# Patient Record
Sex: Male | Born: 1965 | Race: White | Hispanic: No | Marital: Single | Smoking: Never smoker
Health system: Southern US, Community
[De-identification: ages and names within clinical notes are randomized; demographics above are authoritative.]

## PROBLEM LIST (undated history)

## (undated) DIAGNOSIS — J45909 Unspecified asthma, uncomplicated: Secondary | ICD-10-CM

## (undated) DIAGNOSIS — F952 Tourette's disorder: Secondary | ICD-10-CM

## (undated) DIAGNOSIS — I10 Essential (primary) hypertension: Secondary | ICD-10-CM

## (undated) DIAGNOSIS — R569 Unspecified convulsions: Secondary | ICD-10-CM

## (undated) HISTORY — PX: TYMPANOSTOMY TUBE PLACEMENT: SHX32

---

## 2017-03-30 ENCOUNTER — Emergency Department (HOSPITAL_COMMUNITY): Payer: Non-veteran care

## 2017-03-30 ENCOUNTER — Inpatient Hospital Stay (HOSPITAL_COMMUNITY)
Admission: EM | Admit: 2017-03-30 | Discharge: 2017-04-03 | DRG: 637 | Disposition: A | Discharge status: Home / Self Care | Payer: Non-veteran care | Attending: Internal Medicine | Admitting: Internal Medicine

## 2017-03-30 ENCOUNTER — Encounter (HOSPITAL_COMMUNITY): Payer: Self-pay | Admitting: Nurse Practitioner

## 2017-03-30 DIAGNOSIS — G40909 Epilepsy, unspecified, not intractable, without status epilepticus: Secondary | ICD-10-CM | POA: Diagnosis present

## 2017-03-30 DIAGNOSIS — K859 Acute pancreatitis without necrosis or infection, unspecified: Secondary | ICD-10-CM

## 2017-03-30 DIAGNOSIS — Z79899 Other long term (current) drug therapy: Secondary | ICD-10-CM | POA: Diagnosis not present

## 2017-03-30 DIAGNOSIS — J45909 Unspecified asthma, uncomplicated: Secondary | ICD-10-CM | POA: Diagnosis present

## 2017-03-30 DIAGNOSIS — R1084 Generalized abdominal pain: Secondary | ICD-10-CM

## 2017-03-30 DIAGNOSIS — K219 Gastro-esophageal reflux disease without esophagitis: Secondary | ICD-10-CM | POA: Diagnosis present

## 2017-03-30 DIAGNOSIS — I10 Essential (primary) hypertension: Secondary | ICD-10-CM | POA: Diagnosis present

## 2017-03-30 DIAGNOSIS — R112 Nausea with vomiting, unspecified: Secondary | ICD-10-CM

## 2017-03-30 DIAGNOSIS — Z6841 Body Mass Index (BMI) 40.0 and over, adult: Secondary | ICD-10-CM

## 2017-03-30 DIAGNOSIS — Z888 Allergy status to other drugs, medicaments and biological substances status: Secondary | ICD-10-CM | POA: Diagnosis not present

## 2017-03-30 DIAGNOSIS — Z880 Allergy status to penicillin: Secondary | ICD-10-CM

## 2017-03-30 DIAGNOSIS — D649 Anemia, unspecified: Secondary | ICD-10-CM | POA: Diagnosis present

## 2017-03-30 DIAGNOSIS — F952 Tourette's disorder: Secondary | ICD-10-CM | POA: Diagnosis present

## 2017-03-30 DIAGNOSIS — K760 Fatty (change of) liver, not elsewhere classified: Secondary | ICD-10-CM | POA: Diagnosis present

## 2017-03-30 DIAGNOSIS — G4733 Obstructive sleep apnea (adult) (pediatric): Secondary | ICD-10-CM | POA: Diagnosis present

## 2017-03-30 DIAGNOSIS — Z7982 Long term (current) use of aspirin: Secondary | ICD-10-CM | POA: Diagnosis not present

## 2017-03-30 DIAGNOSIS — N179 Acute kidney failure, unspecified: Secondary | ICD-10-CM | POA: Diagnosis present

## 2017-03-30 DIAGNOSIS — R109 Unspecified abdominal pain: Secondary | ICD-10-CM | POA: Diagnosis present

## 2017-03-30 DIAGNOSIS — Z9114 Patient's other noncompliance with medication regimen: Secondary | ICD-10-CM | POA: Diagnosis not present

## 2017-03-30 DIAGNOSIS — R630 Anorexia: Secondary | ICD-10-CM | POA: Diagnosis present

## 2017-03-30 DIAGNOSIS — E871 Hypo-osmolality and hyponatremia: Secondary | ICD-10-CM | POA: Diagnosis present

## 2017-03-30 DIAGNOSIS — E1110 Type 2 diabetes mellitus with ketoacidosis without coma: Secondary | ICD-10-CM | POA: Diagnosis present

## 2017-03-30 DIAGNOSIS — R5383 Other fatigue: Secondary | ICD-10-CM

## 2017-03-30 DIAGNOSIS — R569 Unspecified convulsions: Secondary | ICD-10-CM

## 2017-03-30 HISTORY — DX: Essential (primary) hypertension: I10

## 2017-03-30 HISTORY — DX: Unspecified convulsions: R56.9

## 2017-03-30 HISTORY — DX: Tourette's disorder: F95.2

## 2017-03-30 HISTORY — DX: Unspecified asthma, uncomplicated: J45.909

## 2017-03-30 LAB — BASIC METABOLIC PANEL
ANION GAP: 13 (ref 5–15)
BUN: 54 mg/dL — ABNORMAL HIGH (ref 6–20)
CALCIUM: 9.2 mg/dL (ref 8.9–10.3)
CO2: 14 mmol/L — ABNORMAL LOW (ref 22–32)
Chloride: 107 mmol/L (ref 101–111)
Creatinine, Ser: 1.85 mg/dL — ABNORMAL HIGH (ref 0.61–1.24)
GFR calc Af Amer: 47 mL/min — ABNORMAL LOW (ref >60)
GFR, EST NON AFRICAN AMERICAN: 40 mL/min — AB (ref >60)
Glucose, Bld: 214 mg/dL — ABNORMAL HIGH (ref 65–99)
POTASSIUM: 4.5 mmol/L (ref 3.5–5.1)
Sodium: 134 mmol/L — ABNORMAL LOW (ref 135–145)

## 2017-03-30 LAB — CBC WITH DIFFERENTIAL/PLATELET
Basophils Absolute: 0 10*3/uL (ref 0.0–0.1)
Basophils Relative: 0 %
Eosinophils Absolute: 0 10*3/uL (ref 0.0–0.7)
Eosinophils Relative: 0 %
HEMATOCRIT: 45.7 % (ref 39.0–52.0)
HEMOGLOBIN: 15.8 g/dL (ref 13.0–17.0)
LYMPHS ABS: 1.2 10*3/uL (ref 0.7–4.0)
LYMPHS PCT: 13 %
MCH: 29.8 pg (ref 26.0–34.0)
MCHC: 34.6 g/dL (ref 30.0–36.0)
MCV: 86.2 fL (ref 78.0–100.0)
MONOS PCT: 9 %
Monocytes Absolute: 0.9 10*3/uL (ref 0.1–1.0)
NEUTROS PCT: 78 %
Neutro Abs: 7.5 10*3/uL (ref 1.7–7.7)
Platelets: 219 10*3/uL (ref 150–400)
RBC: 5.3 MIL/uL (ref 4.22–5.81)
RDW: 14.4 % (ref 11.5–15.5)
WBC: 9.6 10*3/uL (ref 4.0–10.5)

## 2017-03-30 LAB — CBG MONITORING, ED
GLUCOSE-CAPILLARY: 564 mg/dL — AB (ref 65–99)
Glucose-Capillary: 481 mg/dL — ABNORMAL HIGH (ref 65–99)
Glucose-Capillary: 352 mg/dL — ABNORMAL HIGH (ref 65–99)

## 2017-03-30 LAB — COMPREHENSIVE METABOLIC PANEL
ALK PHOS: 84 U/L (ref 38–126)
ALT: 62 U/L (ref 17–63)
ANION GAP: 20 — AB (ref 5–15)
AST: 34 U/L (ref 15–41)
Albumin: 4 g/dL (ref 3.5–5.0)
BILIRUBIN TOTAL: 1.4 mg/dL — AB (ref 0.3–1.2)
BUN: 57 mg/dL — ABNORMAL HIGH (ref 6–20)
CO2: 9 mmol/L — ABNORMAL LOW (ref 22–32)
CREATININE: 2.16 mg/dL — AB (ref 0.61–1.24)
Calcium: 9.7 mg/dL (ref 8.9–10.3)
Chloride: 94 mmol/L — ABNORMAL LOW (ref 101–111)
GFR calc non Af Amer: 33 mL/min — ABNORMAL LOW (ref >60)
GFR, EST AFRICAN AMERICAN: 39 mL/min — AB (ref >60)
Glucose, Bld: 768 mg/dL (ref 65–99)
Potassium: 5.9 mmol/L — ABNORMAL HIGH (ref 3.5–5.1)
Sodium: 123 mmol/L — ABNORMAL LOW (ref 135–145)
Total Protein: 8.5 g/dL — ABNORMAL HIGH (ref 6.5–8.1)

## 2017-03-30 LAB — URINALYSIS, ROUTINE W REFLEX MICROSCOPIC
BACTERIA UA: NONE SEEN
BILIRUBIN URINE: NEGATIVE
KETONES UR: 20 mg/dL — AB
Leukocytes, UA: NEGATIVE
NITRITE: NEGATIVE
PH: 5 (ref 5.0–8.0)
Protein, ur: 30 mg/dL — AB
Specific Gravity, Urine: 1.02 (ref 1.005–1.030)

## 2017-03-30 LAB — BLOOD GAS, ARTERIAL
Acid-base deficit: 18.9 mmol/L — ABNORMAL HIGH (ref 0.0–2.0)
Bicarbonate: 7.9 mmol/L — ABNORMAL LOW (ref 20.0–28.0)
Drawn by: 103701
FIO2: 21
O2 Saturation: 97.5 %
PCO2 ART: 21.2 mmHg — AB (ref 32.0–48.0)
PH ART: 7.199 — AB (ref 7.350–7.450)
Patient temperature: 98.6
pO2, Arterial: 118 mmHg — ABNORMAL HIGH (ref 83.0–108.0)

## 2017-03-30 LAB — I-STAT TROPONIN, ED: Troponin i, poc: 0 ng/mL (ref 0.00–0.08)

## 2017-03-30 LAB — BLOOD GAS, VENOUS
ACID-BASE DEFICIT: 18.2 mmol/L — AB (ref 0.0–2.0)
Bicarbonate: 9.9 mmol/L — ABNORMAL LOW (ref 20.0–28.0)
O2 SAT: 60.2 %
PCO2 VEN: 29.5 mmHg — AB (ref 44.0–60.0)
PO2 VEN: 36.1 mmHg (ref 32.0–45.0)
Patient temperature: 98.6
pH, Ven: 7.151 — CL (ref 7.250–7.430)

## 2017-03-30 LAB — MAGNESIUM: Magnesium: 2.9 mg/dL — ABNORMAL HIGH (ref 1.7–2.4)

## 2017-03-30 LAB — MRSA PCR SCREENING: MRSA BY PCR: NEGATIVE

## 2017-03-30 LAB — LIPASE, BLOOD: Lipase: 242 U/L — ABNORMAL HIGH (ref 11–51)

## 2017-03-30 LAB — TROPONIN I

## 2017-03-30 LAB — GLUCOSE, CAPILLARY
GLUCOSE-CAPILLARY: 197 mg/dL — AB (ref 65–99)
Glucose-Capillary: 224 mg/dL — ABNORMAL HIGH (ref 65–99)

## 2017-03-30 MED ORDER — FERROUS SULFATE 325 (65 FE) MG PO TABS
325.0000 mg | ORAL_TABLET | Freq: Three times a day (TID) | ORAL | Status: DC
  Administered 2017-03-30 – 2017-04-03 (×11): 325 mg via ORAL
  Filled 2017-03-30 (×11): qty 1

## 2017-03-30 MED ORDER — VITAMIN D3 25 MCG (1000 UNIT) PO TABS
2000.0000 [IU] | ORAL_TABLET | Freq: Every day | ORAL | Status: DC
  Administered 2017-03-31 – 2017-04-03 (×5): 2000 [IU] via ORAL
  Filled 2017-03-30 (×6): qty 2

## 2017-03-30 MED ORDER — CARBAMAZEPINE ER 200 MG PO TB12
600.0000 mg | ORAL_TABLET | Freq: Two times a day (BID) | ORAL | Status: DC
  Administered 2017-03-30 – 2017-04-03 (×8): 600 mg via ORAL
  Filled 2017-03-30 (×8): qty 3

## 2017-03-30 MED ORDER — ENOXAPARIN SODIUM 40 MG/0.4ML ~~LOC~~ SOLN
40.0000 mg | SUBCUTANEOUS | Status: DC
  Administered 2017-03-30 – 2017-04-02 (×4): 40 mg via SUBCUTANEOUS
  Filled 2017-03-30 (×4): qty 0.4

## 2017-03-30 MED ORDER — CLONIDINE HCL 0.1 MG PO TABS
0.1000 mg | ORAL_TABLET | Freq: Three times a day (TID) | ORAL | Status: DC
  Administered 2017-03-30 – 2017-04-03 (×11): 0.1 mg via ORAL
  Filled 2017-03-30 (×11): qty 1

## 2017-03-30 MED ORDER — MORPHINE SULFATE (PF) 4 MG/ML IV SOLN
4.0000 mg | Freq: Once | INTRAVENOUS | Status: AC
  Administered 2017-03-30: 4 mg via INTRAVENOUS
  Filled 2017-03-30: qty 1

## 2017-03-30 MED ORDER — VITAMIN B-12 1000 MCG PO TABS
5000.0000 ug | ORAL_TABLET | Freq: Every day | ORAL | Status: DC
  Administered 2017-03-31 – 2017-04-03 (×5): 5000 ug via ORAL
  Filled 2017-03-30 (×6): qty 5

## 2017-03-30 MED ORDER — SODIUM CHLORIDE 0.9 % IV BOLUS (SEPSIS)
1000.0000 mL | Freq: Once | INTRAVENOUS | Status: AC
  Administered 2017-03-30: 1000 mL via INTRAVENOUS

## 2017-03-30 MED ORDER — ASPIRIN EC 81 MG PO TBEC
81.0000 mg | DELAYED_RELEASE_TABLET | Freq: Every day | ORAL | Status: DC
  Administered 2017-03-30 – 2017-04-03 (×5): 81 mg via ORAL
  Filled 2017-03-30 (×5): qty 1

## 2017-03-30 MED ORDER — DEXTROSE-NACL 5-0.45 % IV SOLN: INTRAVENOUS | Status: DC

## 2017-03-30 MED ORDER — FAMOTIDINE IN NACL 20-0.9 MG/50ML-% IV SOLN
20.0000 mg | Freq: Once | INTRAVENOUS | Status: AC
  Administered 2017-03-30: 20 mg via INTRAVENOUS
  Filled 2017-03-30: qty 50

## 2017-03-30 MED ORDER — ADULT MULTIVITAMIN W/MINERALS CH
1.0000 | ORAL_TABLET | Freq: Every day | ORAL | Status: DC
  Administered 2017-03-30 – 2017-04-03 (×5): 1 via ORAL
  Filled 2017-03-30 (×5): qty 1

## 2017-03-30 MED ORDER — ALBUTEROL SULFATE (2.5 MG/3ML) 0.083% IN NEBU
3.0000 mL | INHALATION_SOLUTION | Freq: Four times a day (QID) | RESPIRATORY_TRACT | Status: DC | PRN
Start: 2017-03-30 — End: 2017-04-03
  Administered 2017-03-31: 3 mL via RESPIRATORY_TRACT

## 2017-03-30 MED ORDER — DEXTROSE-NACL 5-0.45 % IV SOLN
INTRAVENOUS | Status: DC
  Administered 2017-03-31 (×2): via INTRAVENOUS

## 2017-03-30 MED ORDER — ONDANSETRON HCL 4 MG/2ML IJ SOLN
4.0000 mg | Freq: Once | INTRAMUSCULAR | Status: AC
  Administered 2017-03-30: 4 mg via INTRAVENOUS
  Filled 2017-03-30: qty 2

## 2017-03-30 MED ORDER — COQ10 200 MG PO CAPS: 200.0000 mg | ORAL_CAPSULE | Freq: Every day | ORAL | Status: DC

## 2017-03-30 MED ORDER — SODIUM CHLORIDE 0.9 % IV SOLN
INTRAVENOUS | Status: DC
  Administered 2017-03-30: 5.4 [IU]/h via INTRAVENOUS
  Filled 2017-03-30: qty 1

## 2017-03-30 MED ORDER — SODIUM CHLORIDE 0.9 % IV SOLN
INTRAVENOUS | Status: DC
  Filled 2017-03-30 (×2): qty 1

## 2017-03-30 MED ORDER — MONTELUKAST SODIUM 10 MG PO TABS
10.0000 mg | ORAL_TABLET | Freq: Every day | ORAL | Status: DC
  Administered 2017-03-30 – 2017-04-02 (×4): 10 mg via ORAL
  Filled 2017-03-30 (×4): qty 1

## 2017-03-30 MED ORDER — SODIUM CHLORIDE 0.9 % IV SOLN
INTRAVENOUS | Status: DC
  Administered 2017-03-30: 22:00:00 via INTRAVENOUS

## 2017-03-30 NOTE — ED Provider Notes (Signed)
[facility]-EMERGENCY DEPT Provider Note   CSN: [id number] Arrival date & time: 03/30/17  1219     History   Chief Complaint No chief complaint on file.   HPI Andre Chandler is a 51 y.o. male with a PMHx of DM2, HTN, fatty liver disease, anemia, seizures, and tourette's, who presents to the ED with complaints of decreased appetite, fatigue, nausea/vomiting, indigestion/gagging acidic taste in mouth/throat, belching, and intermittent abdominal pain 5 days. Patient states that he has pretty much been laying around and sleeping a lot since Sunday, which is not typical for him. He states that on Monday he had one episode of nonbloody nonbilious emesis, however has not had ongoing vomiting but continues to have nausea. He states he has intermittent abdominal pain that he describes as 3/10 dull crampy nonradiating periumbilical pain that worsens when he sits up, and has been improved slightly with peppermint tea. He has not tried anything else for his symptoms. He was unable to get a PCP appointment until September so he came here for further evaluation. He does however have an appointment with his GI doctor on August 15. He states that he sees a GI specialist (Dr Edgardo RoysKatzman at the [facility]) because previously he was having some diarrhea issues, 2 years ago they did an EGD and colonoscopy which were both "negative". Of note, he was started on Metformin in March, but he stopped his metformin about 1 week ago because of GI side effects. He does not monitor his sugars regularly.   He denies fevers, chills, CP, SOB, cough, URI symptoms, diarrhea/constipation, obstipation, melena, hematochezia, hematemesis, hematuria, dysuria, myalgias, arthralgias, numbness, tingling, focal weakness, or any other complaints at this time. Denies recent travel, sick contacts, suspicious food intake, EtOH use, NSAID use, or prior abd surgeries. His PCP is at the [facility].    The history is provided by the patient and medical records. No language  interpreter was used.  Abdominal Pain   This is a new problem. The current episode started more than 2 days ago. The problem occurs daily. The problem has not changed since onset.The pain is associated with an unknown factor. The pain is located in the periumbilical region. The quality of the pain is dull and cramping. The pain is at a severity of 3/10. The pain is mild. Associated symptoms include belching, nausea and vomiting. Pertinent negatives include fever, diarrhea, flatus, hematochezia, melena, constipation, dysuria, hematuria, arthralgias and myalgias. The symptoms are aggravated by certain positions. Relieved by: peppermint tea.    History reviewed. No pertinent past medical history.  There are no active problems to display for this patient.   History reviewed. No pertinent surgical history.     Home Medications    Prior to Admission medications   Medication Sig Start Date End Date Taking? Authorizing Provider  albuterol (PROVENTIL HFA;VENTOLIN HFA) 108 (90 Base) MCG/ACT inhaler Inhale 1-2 puffs into the lungs every 6 (six) hours as needed for wheezing or shortness of breath.   Yes [provider]  aspirin EC 81 MG tablet Take 81 mg by mouth daily.   Yes [provider]  carbamazepine (TEGRETOL XR) 200 MG 12 hr tablet Take 6,000 mg by mouth 2 (two) times daily.   Yes [provider]  cetirizine (ZYRTEC) 10 MG tablet Take 10 mg by mouth at bedtime.   Yes [provider]  cholecalciferol (VITAMIN D) 1000 units tablet Take 2,000 Units by mouth daily.   Yes [provider]  cloNIDine (CATAPRES) 0.1 MG tablet Take  0.1 mg by mouth 3 (three) times daily.   Yes [provider]  Coenzyme Q10 (COQ10) 200 MG CAPS Take 200 mg by mouth daily.   Yes [provider]  Cyanocobalamin 5000 MCG CAPS Take 5,000 mcg by mouth daily.   Yes [provider]  ferrous sulfate 325 (65 FE) MG EC tablet Take 325 mg by mouth 3 (three)  times daily.   Yes [provider]  Boris LownKrill Oil 500 MG CAPS Take 500 mg by mouth 2 (two) times daily.   Yes [provider]  lisinopril (PRINIVIL,ZESTRIL) 20 MG tablet Take 20 mg by mouth daily.   Yes [provider]  montelukast (SINGULAIR) 10 MG tablet Take 10 mg by mouth at bedtime.   Yes [provider]  Multiple Vitamin (MULTIVITAMIN WITH MINERALS) TABS tablet Take 1 tablet by mouth daily.   Yes [provider]  Turmeric 500 MG TABS Take 500 mg by mouth daily.   Yes [provider]    Family History History reviewed. No pertinent family history.  Social History Social History  Substance Use Topics  . Smoking status: Unknown If Ever Smoked  . Smokeless tobacco: Not on file  . Alcohol use No     Allergies   Amoxicillin and Other   Review of Systems Review of Systems  Constitutional: Positive for appetite change and fatigue. Negative for chills and fever.  HENT: Negative for rhinorrhea.   Respiratory: Negative for cough and shortness of breath.   Cardiovascular: Negative for chest pain.  Gastrointestinal: Positive for abdominal pain, nausea and vomiting. Negative for blood in stool, constipation, diarrhea, flatus, hematochezia and melena.       +indigestion/gagging acidic taste in mouth/throat  Genitourinary: Negative for dysuria and hematuria.  Musculoskeletal: Negative for arthralgias and myalgias.  Skin: Negative for color change.  Allergic/Immunologic: Positive for immunocompromised state (DM2).  Neurological: Negative for weakness and numbness.  Psychiatric/Behavioral: Negative for confusion.   All other systems reviewed and are negative for acute change except as noted in the HPI.    Physical Exam Updated Vital Signs BP 124/66 (BP Location: Left Arm)   Pulse 85   Temp 97.6 F (36.4 C) (Oral)   Resp 20   Ht 5\' 7"  (1.702 m)   Wt 131.5 kg (290 lb)   SpO2 98%   BMI 45.42 kg/m   Physical Exam    Constitutional: He is oriented to person, place, and time. Vital signs are normal. He appears well-developed and well-nourished.  Non-toxic appearance. No distress.  Afebrile, nontoxic, NAD  HENT:  Head: Normocephalic and atraumatic.  Mouth/Throat: Oropharynx is clear and moist. Mucous membranes are dry.  Dry tacky mucous membranes  Eyes: Conjunctivae and EOM are normal. Right eye exhibits no discharge. Left eye exhibits no discharge.  Neck: Normal range of motion. Neck supple.  Cardiovascular: Normal rate, regular rhythm, normal heart sounds and intact distal pulses.  Exam reveals no gallop and no friction rub.   No murmur heard. Pulmonary/Chest: Effort normal and breath sounds normal. No respiratory distress. He has no decreased breath sounds. He has no wheezes. He has no rhonchi. He has no rales.  Abdominal: Soft. Normal appearance and bowel sounds are normal. He exhibits no distension. There is tenderness in the right upper quadrant, right lower quadrant, epigastric area and left upper quadrant. There is tenderness at McBurney's point. There is no rigidity, no rebound, no guarding, no CVA tenderness and negative Murphy's sign.  Soft, obese which mildly limits exam  however without definite/obvious distension, +BS throughout, with mild upper abd TTP across entire upper abd, with moderate RLQ TTP at mcburney's point, no r/g/r, neg murphy's, no CVA TTP   Musculoskeletal: Normal range of motion.  Neurological: He is alert and oriented to person, place, and time. He has normal strength. No sensory deficit.  Skin: Skin is warm, dry and intact. No rash noted.  Psychiatric: He has a normal mood and affect.  Nursing note and vitals reviewed.    ED Treatments / Results  Labs (all labs ordered are listed, but only abnormal results are displayed) Labs Reviewed  COMPREHENSIVE METABOLIC PANEL - Abnormal; Notable for the following:       Result Value   Sodium 123 (*)    Potassium 5.9 (*)     Chloride 94 (*)    CO2 9 (*)    Glucose, Bld 768 (*)    BUN 57 (*)    Creatinine, Ser 2.16 (*)    Total Protein 8.5 (*)    Total Bilirubin 1.4 (*)    GFR calc non Af Amer 33 (*)    GFR calc Af Amer 39 (*)    Anion gap 20 (*)    All other components within normal limits  LIPASE, BLOOD - Abnormal; Notable for the following:    Lipase 242 (*)    All other components within normal limits  URINALYSIS, ROUTINE W REFLEX MICROSCOPIC - Abnormal; Notable for the following:    APPearance HAZY (*)    Glucose, UA >=500 (*)    Hgb urine dipstick SMALL (*)    Ketones, ur 20 (*)    Protein, ur 30 (*)    Squamous Epithelial / LPF 0-5 (*)    All other components within normal limits  MAGNESIUM - Abnormal; Notable for the following:    Magnesium 2.9 (*)    All other components within normal limits  BLOOD GAS, VENOUS - Abnormal; Notable for the following:    pH, Ven 7.151 (*)    pCO2, Ven 29.5 (*)    Bicarbonate 9.9 (*)    Acid-base deficit 18.2 (*)    All other components within normal limits  BLOOD GAS, ARTERIAL - Abnormal; Notable for the following:    pH, Arterial 7.199 (*)    pCO2 arterial 21.2 (*)    pO2, Arterial 118 (*)    Bicarbonate 7.9 (*)    Acid-base deficit 18.9 (*)    All other components within normal limits  CBG MONITORING, ED - Abnormal; Notable for the following:    Glucose-Capillary >600 (*)    All other components within normal limits  CBG MONITORING, ED - Abnormal; Notable for the following:    Glucose-Capillary 564 (*)    All other components within normal limits  CBC WITH DIFFERENTIAL/PLATELET  I-STAT TROPONIN, ED    EKG  EKG Interpretation  Date/Time:  Friday March 30 2017 14:09:59 EDT Ventricular Rate:  82 PR Interval:    QRS Duration: 139 QT Interval:  401 QTC Calculation: 469 R Axis:     Text Interpretation:  Sinus rhythm Consider right atrial enlargement IVCD, consider atypical LBBB No previous ECGs available Confirmed by Richardean Canal [phone]) on  03/30/2017 3:34:15 PM       Radiology Ct Abdomen Pelvis Wo Contrast  Result Date: 03/30/2017 CLINICAL DATA:  Patient has been having a loss of appetite one incident of Vomiting. Generalized malaise and nausea. For one week EXAM: CT ABDOMEN AND PELVIS WITHOUT CONTRAST TECHNIQUE: Multidetector CT  imaging of the abdomen and pelvis was performed following the standard protocol without IV contrast. COMPARISON:  None. FINDINGS: Lower chest: No acute abnormality. Hepatobiliary: The liver is diffusely low attenuation, compatible with hepatic steatosis. No focal suspicious liver lesions are identified. There are focal areas of fatty sparing. The gallbladder is present. Pancreas: Unremarkable. No pancreatic ductal dilatation or surrounding inflammatory changes. Spleen: Normal in size without focal abnormality. Adrenals/Urinary Tract: Adrenal glands are normal in appearance. No renal mass. No hydronephrosis. No intrarenal stones or ureteral obstruction. Urinary bladder is decompressed. Stomach/Bowel: The stomach and small bowel loops are normal in appearance. Appendix is not seen. Colon is normal in appearance. Vascular/Lymphatic: No significant vascular findings are present. No enlarged abdominal or pelvic lymph nodes. Reproductive: Minimal prostatic calcifications are present. Otherwise the prostate is normal in size and appearance. Seminal vesicles are normal in appearance. Other: No abdominal wall hernia or abnormality. No abdominopelvic ascites. Musculoskeletal: There is mild degenerative change in the lower thoracic N lumbosacral spine. Disc height loss and vacuum disc at identified at L5-S1. There is 5 mm anterolisthesis of L5 on S1. Bilateral pars defects at L5. No suspicious lytic or blastic lesions are identified. IMPRESSION: 1. Hepatic steatosis. 2. No renal stones or hydronephrosis. 3. Appendix is not well seen but there is no evidence for acute appendicitis. 4. 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 Electronically Signed   By: Norva Pavlov M.D.   On: 03/30/2017 16:38    Procedures Procedures (including critical care time)  CRITICAL CARE--DKA Performed by: Rhona Raider   Total critical care time: 60 minutes  Critical care time was exclusive of separately billable procedures and treating other patients.  Critical care was necessary to treat or prevent imminent or life-threatening deterioration.  Critical care was time spent personally by me on the following activities: development of treatment plan with patient and/or surrogate as well as nursing, discussions with consultants, evaluation of patient's response to treatment, examination of patient, obtaining history from patient or surrogate, ordering and performing treatments and interventions, ordering and review of laboratory studies, ordering and review of radiographic studies, pulse oximetry and re-evaluation of patient's condition.   Medications Ordered in ED Medications  insulin regular (NOVOLIN R,HUMULIN R) 100 Units in sodium chloride 0.9 % 100 mL (1 Units/mL) infusion (5.4 Units/hr Intravenous New Bag/Given 03/30/17 1646)  dextrose 5 %-0.45 % sodium chloride infusion ( Intravenous Hold 03/30/17 1716)  sodium chloride 0.9 % bolus 1,000 mL (not administered)  sodium chloride 0.9 % bolus 1,000 mL (0 mLs Intravenous Stopped 03/30/17 1528)  ondansetron (ZOFRAN) injection 4 mg (4 mg Intravenous Given 03/30/17 1439)  morphine 4 MG/ML injection 4 mg (4 mg Intravenous Given 03/30/17 1449)  famotidine (PEPCID) IVPB 20 mg premix (0 mg Intravenous Stopped 03/30/17 1528)  sodium chloride 0.9 % bolus 1,000 mL (1,000 mLs Intravenous New Bag/Given 03/30/17 1640)     Initial Impression / Assessment and Plan / ED Course  I have reviewed the triage vital signs and the nursing notes.  Pertinent labs & imaging results that were available during my care of the patient were reviewed by me and considered in my  medical decision making (see chart for details).     51 y.o. male here with fatigue, anorexia, indigestion, nausea, one episode of vomiting, and intermittent abd pain x5 days. On exam, mild RUQ/epigastric/LUQ TTP with more moderate RLQ TTP over mcburney's point, no rebound/guarding/rigidity. Will get labs, EKG, and CT abd/pelv to  eval for appendicitis, colitis, etc. Will give fluids, pain meds, pepcid, and nausea meds then reassess shortly.   3:48 PM CBC w/diff WNL. CMP showing bicarb 9, anion gap 20, glucose 768, Cr 2.16, BUN 57, Na 123 likely from hyperglycemia; K 5.9 likely from hyperglycemia; appears to be in acute DKA; added on VBG and Mg level, and glucostabilizer orders started. Lipase 242. U/A with a few ketones and protein, 0-5 squamous so slightly contaminated, but no evidence of UTI. Trop neg. EKG with possible R IVCD/LBBB, no priors to compare. CT abd/pelv going to be done now, however had to change to noncontrast study. Pt will need admission for his DKA.   5:02 PM CT abd/pelv with hepatic steatosis but no other acute findings to explain his symptoms; however, noncontrast CT is less helpful, and abd pain still could be from DKA. Pancreas reported as unremarkable on CT, and gallbladder has no mention of stones. Mg 2.9 which is slightly high. VBG with pH 7.151, will get ABG to confirm that pH is actually that low, then based on those results will decide on which service he would be best admitted by.  Will reassess as soon as we get the ABG  6:20 PM ABG confirms that his pH is 7.199, will consult critical care service to assist with his admission for DKA.   6:25 PM Dr. Isaiah Serge of critical care service returning page, feels he could be considered for step down unit and admitted by [facility] service; they would be happy to be consult via El[facility] if needed, but did not feel he needed to be admitted by their service primarily. Will consult hospitalist service.  7:02 PM Dr. Toniann Fail of [facility] returning  page and will admit. Wants 1L bolus additional ordered now, and then he will see him shortly. Holding orders to be placed by admitting team. Please see their notes for further documentation of care. I appreciate their help with this pleasant pt's care. Pt stable at time of admission.    Final Clinical Impressions(s) / ED Diagnoses   Final diagnoses:  Diabetic ketoacidosis without coma associated with type 2 diabetes mellitus (HCC)  Nausea and vomiting in adult patient  Other fatigue  Generalized abdominal pain  Acute pancreatitis, unspecified complication status, unspecified pancreatitis type    New Prescriptions New Prescriptions   No medications on [address] [place], [place] 03/30/17 1902    Charlynne Pander, MD 03/31/17 [phone]

## 2017-03-30 NOTE — ED Notes (Signed)
Bed: WA20 Expected date:  Expected time:  Means of arrival:  Comments: 51 yo N/V

## 2017-03-30 NOTE — ED Notes (Signed)
Respiratory at bedside.

## 2017-03-30 NOTE — ED Notes (Signed)
Patient transported to CT 

## 2017-03-30 NOTE — ED Triage Notes (Signed)
Patient has been having a loss of appetite one incident of Vomiting. Generalized malaisia and nausea. For one week.No reports of pain.

## 2017-03-30 NOTE — ED Notes (Signed)
Respiratory called about ABG

## 2017-03-30 NOTE — H&P (Signed)
History and Physical    Andre Chandler ZOX:[id number] DOB: 10/16/1965 DOA: 03/30/2017  PCP: System, Pcp Not In  Patient coming from: Home.  Chief Complaint: Fatigue and weakness.  HPI: Andre Chandler is a 51 y.o. male with history of diabetes mellitus type 2, seizures, hypertension, Tourette's presents to the ER because of persistent weakness and fatigue last 3-4 days. Patient also has been on some nausea with abdominal discomfort. Denies any vomiting. Patient had stopped his metformin last week due to diarrhea and GI side effects and patient primary care physician was planning to change to Glucotrol. Patient is yet to start the Glucotrol. Denies any chest pain or shortness of breath fever chills.   ED Course: In the ER patient blood sugars found to be 768 with bicarbonate of 9 and creatinine of 2.1 with markedly elevated gap. Patient was started on fluid bolus and IV insulin infusion for DKA. Patient has some abdominal discomfort with some nausea CT scan without contrast was done which was unremarkable. Abdomen appears benign at this time.  Review of Systems: As per HPI, rest all negative.   Past Medical History:  Diagnosis Date  . Asthma   . Hypertension   . Seizures (HCC)    frontal lobe seizure disorder  . Tourette's syndrome     Past Surgical History:  Procedure Laterality Date  . TYMPANOSTOMY TUBE PLACEMENT Left    left ear tube in childhood     reports that he has never smoked. He has never used smokeless tobacco. He reports that he does not drink alcohol or use drugs.  Allergies  Allergen Reactions  . Amoxicillin     Mold allergy   . Other     Pt doesn't take steroids because it interferes with tegretol     Family History  Problem Relation Age of Onset  . Diabetes Mellitus II Mother   . CAD Neg Hx     Prior to Admission medications   Medication Sig Start Date End Date Taking? Authorizing Provider  albuterol (PROVENTIL HFA;VENTOLIN HFA) 108 (90 Base) MCG/ACT  inhaler Inhale 1-2 puffs into the lungs every 6 (six) hours as needed for wheezing or shortness of breath.   Yes [provider]  aspirin EC 81 MG tablet Take 81 mg by mouth daily.   Yes [provider]  carbamazepine (TEGRETOL XR) 200 MG 12 hr tablet Take 600 mg by mouth 2 (two) times daily.    Yes [provider]  cetirizine (ZYRTEC) 10 MG tablet Take 10 mg by mouth at bedtime.   Yes [provider]  cholecalciferol (VITAMIN D) 1000 units tablet Take 2,000 Units by mouth daily.   Yes [provider]  cloNIDine (CATAPRES) 0.1 MG tablet Take 0.1 mg by mouth 3 (three) times daily.   Yes [provider]  Coenzyme Q10 (COQ10) 200 MG CAPS Take 200 mg by mouth daily.   Yes [provider]  Cyanocobalamin 5000 MCG CAPS Take 5,000 mcg by mouth daily.   Yes [provider]  ferrous sulfate 325 (65 FE) MG EC tablet Take 325 mg by mouth 3 (three) times daily.   Yes [provider]  Boris LownKrill Oil 500 MG CAPS Take 500 mg by mouth 2 (two) times daily.   Yes [provider]  lisinopril (PRINIVIL,ZESTRIL) 20 MG tablet Take 20 mg by mouth daily.   Yes [provider]  montelukast (SINGULAIR) 10 MG tablet Take 10 mg by mouth at bedtime.   Yes [provider]  Multiple Vitamin (MULTIVITAMIN WITH MINERALS) TABS tablet Take 1 tablet by mouth daily.   Yes [provider]  Turmeric 500 MG TABS Take 500 mg by mouth daily.   Yes [provider]    Physical Exam: Vitals:   03/30/17 1800 03/30/17 1900 03/30/17 1939 03/30/17 1942  BP:   (!) 89/52 (!) 112/54  Pulse: 80 82 78   Resp: 19 19 16    Temp:      TempSrc:      SpO2: 100% 99% 99%   Weight:      Height:          Constitutional: Moderately built and nourished. Vitals:   03/30/17 1800 03/30/17 1900 03/30/17 1939 03/30/17 1942  BP:   (!) 89/52 (!) 112/54  Pulse: 80 82 78   Resp: 19 19 16    Temp:      TempSrc:      SpO2: 100% 99% 99%    Weight:      Height:       Eyes: Anicteric no pallor. ENMT: No discharge from the ears eyes nose and mouth. Neck: No neck rigidity no mass felt no JVD appreciated. Respiratory: No rhonchi or crepitations. Cardiovascular: S1-S2 heard no murmurs appreciated. Abdomen: Soft nontender bowel sounds present. Musculoskeletal: No edema. No joint effusion. Skin: No rash. Skin appears warm. Neurologic: Alert awake oriented to time place and person. Moves all extremities. Psychiatric: Appears normal. Normal affect.   Labs on Admission: I have personally reviewed following labs and imaging studies  CBC:  Recent Labs Lab 03/30/17 1419  WBC 9.6  NEUTROABS 7.5  HGB 15.8  HCT 45.7  MCV 86.2  PLT 219   Basic Metabolic Panel:  Recent Labs Lab 03/30/17 1419  NA 123*  K 5.9*  CL 94*  CO2 9*  GLUCOSE 768*  BUN 57*  CREATININE 2.16*  CALCIUM 9.7  MG 2.9*   GFR: Estimated Creatinine Clearance: 52.2 mL/min (A) (by C-G formula based on SCr of 2.16 mg/dL (H)). Liver Function Tests:  Recent Labs Lab 03/30/17 1419  AST 34  ALT 62  ALKPHOS 84  BILITOT 1.4*  PROT 8.5*  ALBUMIN 4.0    Recent Labs Lab 03/30/17 1419  LIPASE 242*   No results for input(s): AMMONIA in the last 168 hours. Coagulation Profile: No results for input(s): INR, PROTIME in the last 168 hours. Cardiac Enzymes: No results for input(s): CKTOTAL, CKMB, CKMBINDEX, TROPONINI in the last 168 hours. BNP (last 3 results) No results for input(s): PROBNP in the last 8760 hours. HbA1C: No results for input(s): HGBA1C in the last 72 hours. CBG:  Recent Labs Lab 03/30/17 1643 03/30/17 1756 03/30/17 1901 03/30/17 2011  GLUCAP >600* 564* 481* 352*   Lipid Profile: No results for input(s): CHOL, HDL, LDLCALC, TRIG, CHOLHDL, LDLDIRECT in the last 72 hours. Thyroid Function Tests: No results for input(s): TSH, T4TOTAL, FREET4, T3FREE, THYROIDAB in the last 72 hours. Anemia Panel: No results for input(s):  VITAMINB12, FOLATE, FERRITIN, TIBC, IRON, RETICCTPCT in the last 72 hours. Urine analysis:    Component Value Date/Time   COLORURINE YELLOW 03/30/2017 1410   APPEARANCEUR HAZY (A) 03/30/2017 1410   LABSPEC 1.020 03/30/2017 1410   PHURINE 5.0 03/30/2017 1410   GLUCOSEU >=500 (A) 03/30/2017 1410   HGBUR SMALL (A) 03/30/2017 1410   BILIRUBINUR NEGATIVE 03/30/2017 1410   KETONESUR 20 (A) 03/30/2017 1410   PROTEINUR 30 (A) 03/30/2017 1410   NITRITE NEGATIVE 03/30/2017 1410   LEUKOCYTESUR NEGATIVE 03/30/2017 1410  Sepsis Labs: @LABRCNTIP (procalcitonin:4,lacticidven:4) )No results found for this or any previous visit (from the past 240 hour(s)).   Radiological Exams on Admission: Ct Abdomen Pelvis Wo Contrast  Result Date: 03/30/2017 CLINICAL DATA:  Patient has been having a loss of appetite one incident of Vomiting. Generalized malaise and nausea. For one week EXAM: CT ABDOMEN AND PELVIS WITHOUT CONTRAST TECHNIQUE: Multidetector CT imaging of the abdomen and pelvis was performed following the standard protocol without IV contrast. COMPARISON:  None. FINDINGS: Lower chest: No acute abnormality. Hepatobiliary: The liver is diffusely low attenuation, compatible with hepatic steatosis. No focal suspicious liver lesions are identified. There are focal areas of fatty sparing. The gallbladder is present. Pancreas: Unremarkable. No pancreatic ductal dilatation or surrounding inflammatory changes. Spleen: Normal in size without focal abnormality. Adrenals/Urinary Tract: Adrenal glands are normal in appearance. No renal mass. No hydronephrosis. No intrarenal stones or ureteral obstruction. Urinary bladder is decompressed. Stomach/Bowel: The stomach and small bowel loops are normal in appearance. Appendix is not seen. Colon is normal in appearance. Vascular/Lymphatic: No significant vascular findings are present. No enlarged abdominal or pelvic lymph nodes. Reproductive: Minimal prostatic calcifications are  present. Otherwise the prostate is normal in size and appearance. Seminal vesicles are normal in appearance. Other: No abdominal wall hernia or abnormality. No abdominopelvic ascites. Musculoskeletal: There is mild degenerative change in the lower thoracic N lumbosacral spine. Disc height loss and vacuum disc at identified at L5-S1. There is 5 mm anterolisthesis of L5 on S1. Bilateral pars defects at L5. No suspicious lytic or blastic lesions are identified. IMPRESSION: 1. Hepatic steatosis. 2. No renal stones or hydronephrosis. 3. Appendix is not well seen but there is no evidence for acute appendicitis. 4. Bilateral pars defects at L5, associated with grade 1 anterolisthesis of L5 on S1. Electronically Signed   By: Norva PavlovElizabeth  Brown M.D.   On: 03/30/2017 16:38     Assessment/Plan Principal Problem:   DKA (diabetic ketoacidoses) (HCC) Active Problems:   Seizure (HCC)   Essential hypertension   Tourette's   ARF (acute renal failure) (HCC)   Abdominal pain   OSA (obstructive sleep apnea)    1. Diabetic ketoacidosis - precipitating cause not clear. Patient has been placed on IV insulin infusion and aggressive fluid hydration. Follow metabolic panel closely. Check hemoglobin A1c. Once anion gap gets corrected change to subcutaneous insulin. 2. Hypertension - will hold off lisinopril due to renal failure. When necessary IV hydralazine for now. 3. History of seizures on carbamazepine. Check levels. 4. Renal failure probably acute - no old labs to compare. Hydrate and follow metabolic panel. Hold lisinopril. 5. History of Tourette's syndrome on clonidine. 6. Sleep apnea on CPAP.   DVT prophylaxis: Lovenox. Code Status: Full code.  Family Communication: Patient's mother.  Disposition Plan: Home.  Consults called: None.  Admission status: Inpatient.    Eduard ClosKAKRAKANDY,Jayln Madeira N. MD [facility] Pager [phone].  If 7PM-7AM, please contact night-coverage [email or web address] Password  [email or web address]  03/30/2017, 9:23 PM

## 2017-03-30 NOTE — ED Notes (Signed)
Respiratory made aware of ABG order. 

## 2017-03-30 NOTE — ED Notes (Signed)
ED TO INPATIENT HANDOFF REPORT  Name/Age/Gender Andre Chandler 51 y.o. male  Home/SNF/Other Home  Chief Complaint Nausea   Code Status History    This patient does not have a recorded code status. Please follow your organizational policy for patients in this situation.      Level of Care/Admitting Diagnosis ED Disposition    ED Disposition Condition Comment   Admit  Hospital Area: [place] [facility] [[id number]]  Level of Care: Stepdown [14]  Admit to SDU based on following criteria: Severe physiological/psychological symptoms:  Any diagnosis requiring assessment & intervention at least every 4 hours on an ongoing basis to obtain desired patient outcomes including stability and rehabilitation  Diagnosis: DKA (diabetic ketoacidoses) [facility]) [[id number]]  Admitting Physician: Rise Patience [phone]  Attending Physician: Rise Patience [phone]  Estimated length of stay: past midnight tomorrow  Certification:: I certify this patient will need inpatient services for at least 2 midnights  PT Class (Do Not Modify): Inpatient [101]  PT Acc Code (Do Not Modify): Private [1]       Medical History Past Medical History:  Diagnosis Date  . Asthma   . Hypertension   . Seizures ([place])    frontal lobe seizure disorder  . Tourette's syndrome     Allergies Allergies  Allergen Reactions  . Amoxicillin     Mold allergy   . Other     Pt doesn't take steroids because it interferes with tegretol     IV Location/Drains/Wounds Patient Lines/Drains/Airways Status   Active Line/Drains/Airways    Name:   Placement date:   Placement time:   Site:   Days:   Peripheral IV 03/30/17 Right Forearm  03/30/17    1452    Forearm    less than 1          Labs/Imaging Results for orders placed or performed during the hospital encounter of 03/30/17 (from the past 48 hour(s))  Urinalysis, Routine w reflex microscopic     Status: Abnormal   Collection Time: 03/30/17  2:10 PM   Result Value Ref Range   Color, Urine YELLOW YELLOW   APPearance HAZY (A) CLEAR   Specific Gravity, Urine 1.020 1.005 - 1.030   pH 5.0 5.0 - 8.0   Glucose, UA >=500 (A) NEGATIVE mg/dL   Hgb urine dipstick SMALL (A) NEGATIVE   Bilirubin Urine NEGATIVE NEGATIVE   Ketones, ur 20 (A) NEGATIVE mg/dL   Protein, ur 30 (A) NEGATIVE mg/dL   Nitrite NEGATIVE NEGATIVE   Leukocytes, UA NEGATIVE NEGATIVE   RBC / HPF 0-5 0 - 5 RBC/hpf   WBC, UA 0-5 0 - 5 WBC/hpf   Bacteria, UA NONE SEEN NONE SEEN   Squamous Epithelial / LPF 0-5 (A) NONE SEEN   Mucous PRESENT    Granular Casts, UA PRESENT   CBC with Differential     Status: None   Collection Time: 03/30/17  2:19 PM  Result Value Ref Range   WBC 9.6 4.0 - 10.5 K/uL   RBC 5.30 4.22 - 5.81 MIL/uL   Hemoglobin 15.8 13.0 - 17.0 g/dL   HCT 45.7 39.0 - 52.0 %   MCV 86.2 78.0 - 100.0 fL   MCH 29.8 26.0 - 34.0 pg   MCHC 34.6 30.0 - 36.0 g/dL   RDW 14.4 11.5 - 15.5 %   Platelets 219 150 - 400 K/uL   Neutrophils Relative % 78 %   Neutro Abs 7.5 1.7 - 7.7 K/uL   Lymphocytes Relative 13 %  Lymphs Abs 1.2 0.7 - 4.0 K/uL   Monocytes Relative 9 %   Monocytes Absolute 0.9 0.1 - 1.0 K/uL   Eosinophils Relative 0 %   Eosinophils Absolute 0.0 0.0 - 0.7 K/uL   Basophils Relative 0 %   Basophils Absolute 0.0 0.0 - 0.1 K/uL  Comprehensive metabolic panel     Status: Abnormal   Collection Time: 03/30/17  2:19 PM  Result Value Ref Range   Sodium 123 (L) 135 - 145 mmol/L   Potassium 5.9 (H) 3.5 - 5.1 mmol/L   Chloride 94 (L) 101 - 111 mmol/L   CO2 9 (L) 22 - 32 mmol/L   Glucose, Bld 768 (HH) 65 - 99 mg/dL    Comment: CRITICAL RESULT CALLED TO, READ BACK BY AND VERIFIED WITH: RAND,K. RN _0  ON 08.03.18 BY COHEN,K    BUN 57 (H) 6 - 20 mg/dL   Creatinine, Ser 2.16 (H) 0.61 - 1.24 mg/dL   Calcium 9.7 8.9 - 10.3 mg/dL   Total Protein 8.5 (H) 6.5 - 8.1 g/dL   Albumin 4.0 3.5 - 5.0 g/dL   AST 34 15 - 41 U/L   ALT 62 17 - 63 U/L   Alkaline Phosphatase  84 38 - 126 U/L   Total Bilirubin 1.4 (H) 0.3 - 1.2 mg/dL   GFR calc non Af Amer 33 (L) >60 mL/min   GFR calc Af Amer 39 (L) >60 mL/min    Comment: (NOTE) The eGFR has been calculated using the CKD EPI equation. This calculation has not been validated in all clinical situations. eGFR's persistently <60 mL/min signify possible Chronic Kidney Disease.    Anion gap 20 (H) 5 - 15  Lipase, blood     Status: Abnormal   Collection Time: 03/30/17  2:19 PM  Result Value Ref Range   Lipase 242 (H) 11 - 51 U/L  Magnesium     Status: Abnormal   Collection Time: 03/30/17  2:19 PM  Result Value Ref Range   Magnesium 2.9 (H) 1.7 - 2.4 mg/dL  I-stat troponin, ED     Status: None   Collection Time: 03/30/17  2:42 PM  Result Value Ref Range   Troponin i, poc 0.00 0.00 - 0.08 ng/mL   Comment 3            Comment: Due to the release kinetics of cTnI, a negative result within the first hours of the onset of symptoms does not rule out myocardial infarction with certainty. If myocardial infarction is still suspected, repeat the test at appropriate intervals.   Blood gas, venous     Status: Abnormal   Collection Time: 03/30/17  3:56 PM  Result Value Ref Range   pH, Ven 7.151 (LL) 7.250 - 7.430    Comment: CRITICAL RESULT CALLED TO, READ BACK BY AND VERIFIED WITH: R.KESLAR,RN AT 1630 BY T.BURGESS,RRT,RCP ON 03/30/2017    pCO2, Ven 29.5 (L) 44.0 - 60.0 mmHg   pO2, Ven 36.1 32.0 - 45.0 mmHg   Bicarbonate 9.9 (L) 20.0 - 28.0 mmol/L   Acid-base deficit 18.2 (H) 0.0 - 2.0 mmol/L   O2 Saturation 60.2 %   Patient temperature 98.6    Collection site VEIN    Drawn by COLLECTED BY NURSE    Sample type VENOUS   Blood gas, arterial     Status: Abnormal   Collection Time: 03/30/17  4:40 PM  Result Value Ref Range   FIO2 21.00    Delivery systems ROOM AIR  pH, Arterial 7.199 (LL) 7.350 - 7.450    Comment: CRITICAL RESULT CALLED TO, READ BACK BY AND VERIFIED WITH: DR.YAO AT 1750 BY T.BURGESS,RRT,RCP  ON 03/30/2017    pCO2 arterial 21.2 (L) 32.0 - 48.0 mmHg   pO2, Arterial 118 (H) 83.0 - 108.0 mmHg   Bicarbonate 7.9 (L) 20.0 - 28.0 mmol/L   Acid-base deficit 18.9 (H) 0.0 - 2.0 mmol/L   O2 Saturation 97.5 %   Patient temperature 98.6    Collection site RIGHT RADIAL    Drawn by [id number]    Sample type ARTERIAL    Allens test (pass/fail) PASS PASS  CBG monitoring, ED     Status: Abnormal   Collection Time: 03/30/17  4:43 PM  Result Value Ref Range   Glucose-Capillary >600 (HH) 65 - 99 mg/dL  CBG monitoring, ED     Status: Abnormal   Collection Time: 03/30/17  5:56 PM  Result Value Ref Range   Glucose-Capillary 564 (HH) 65 - 99 mg/dL  CBG monitoring, ED     Status: Abnormal   Collection Time: 03/30/17  7:01 PM  Result Value Ref Range   Glucose-Capillary 481 (H) 65 - 99 mg/dL   Ct Abdomen Pelvis Wo Contrast  Result Date: 03/30/2017 CLINICAL DATA:  Patient has been having a loss of appetite one incident of Vomiting. Generalized malaise and nausea. For one week EXAM: CT ABDOMEN AND PELVIS WITHOUT CONTRAST TECHNIQUE: Multidetector CT imaging of the abdomen and pelvis was performed following the standard protocol without IV contrast. COMPARISON:  None. FINDINGS: Lower chest: No acute abnormality. Hepatobiliary: The liver is diffusely low attenuation, compatible with hepatic steatosis. No focal suspicious liver lesions are identified. There are focal areas of fatty sparing. The gallbladder is present. Pancreas: Unremarkable. No pancreatic ductal dilatation or surrounding inflammatory changes. Spleen: Normal in size without focal abnormality. Adrenals/Urinary Tract: Adrenal glands are normal in appearance. No renal mass. No hydronephrosis. No intrarenal stones or ureteral obstruction. Urinary bladder is decompressed. Stomach/Bowel: The stomach and small bowel loops are normal in appearance. Appendix is not seen. Colon is normal in appearance. Vascular/Lymphatic: No significant vascular findings are  present. No enlarged abdominal or pelvic lymph nodes. Reproductive: Minimal prostatic calcifications are present. Otherwise the prostate is normal in size and appearance. Seminal vesicles are normal in appearance. Other: No abdominal wall hernia or abnormality. No abdominopelvic ascites. Musculoskeletal: There is mild degenerative change in the lower thoracic N lumbosacral spine. Disc height loss and vacuum disc at identified at L5-S1. There is 5 mm anterolisthesis of L5 on S1. Bilateral pars defects at L5. No suspicious lytic or blastic lesions are identified. IMPRESSION: 1. Hepatic steatosis. 2. No renal stones or hydronephrosis. 3. Appendix is not well seen but there is no evidence for acute appendicitis. 4. Bilateral pars defects at L5, associated with grade 1 anterolisthesis of L5 on S1. Electronically Signed   By: Nolon Nations M.D.   On: 03/30/2017 16:38    Pending Labs Unresulted Labs    None      Isolation Precautions No active isolations  Vitals/Pain Today's Vitals   03/30/17 1641 03/30/17 1700 03/30/17 1800 03/30/17 1900  BP:      Pulse:  84 80 82  Resp:  (!) _0 Temp:      TempSrc:      SpO2:  99% 100% 99%  Weight:      Height:      PainSc: 0-No pain       Medications Medications  insulin regular (NOVOLIN R,HUMULIN R) 100 Units in sodium chloride 0.9 % 100 mL (1 Units/mL) infusion (8.4 Units/hr Intravenous Rate/Dose Change 03/30/17 1903)  dextrose 5 %-0.45 % sodium chloride infusion ( Intravenous Hold 03/30/17 1716)  sodium chloride 0.9 % bolus 1,000 mL (0 mLs Intravenous Stopped 03/30/17 1528)  ondansetron (ZOFRAN) injection 4 mg (4 mg Intravenous Given 03/30/17 1439)  morphine 4 MG/ML injection 4 mg (4 mg Intravenous Given 03/30/17 1449)  famotidine (PEPCID) IVPB 20 mg premix (0 mg Intravenous Stopped 03/30/17 1528)  sodium chloride 0.9 % bolus 1,000 mL (0 mLs Intravenous Stopped 03/30/17 1904)  sodium chloride 0.9 % bolus 1,000 mL (1,000 mLs Intravenous New Bag/Given  03/30/17 1909)    Mobility walks with person assist

## 2017-03-30 NOTE — Progress Notes (Signed)
PHARMACIST - PHYSICIAN ORDER COMMUNICATION  CONCERNING: P&T Medication Policy on Herbal Medications  DESCRIPTION:  This patient's order for:  CoQ-10  has been noted.  This product(s) is classified as an "herbal" or natural product. Due to a lack of definitive safety studies or FDA approval, nonstandard manufacturing practices, plus the potential risk of unknown drug-drug interactions while on inpatient medications, the Pharmacy and Therapeutics Committee does not permit the use of "herbal" or natural products of this type [facility].   ACTION TAKEN: The pharmacy department is unable to verify this order at this time and the order has been discontinued. Please reevaluate patient's clinical condition at discharge and address if the herbal or natural product(s) should be resumed at that time.  Adalberto ColeNikola Willmer Fellers, PharmD, BCPS Pager [phone] 03/30/2017 9:26 PM

## 2017-03-30 NOTE — Progress Notes (Signed)
Pt refused hospital CPAP use.  States mom will be bringing his from home late on.  Rt informed to let us know if he needs one before then.  Rt will monitor as needed.

## 2017-03-30 NOTE — ED Notes (Signed)
Delay in vitals and VBG. Pt transported to CT.

## 2017-03-31 ENCOUNTER — Inpatient Hospital Stay (HOSPITAL_COMMUNITY): Payer: Non-veteran care

## 2017-03-31 LAB — GLUCOSE, CAPILLARY
GLUCOSE-CAPILLARY: 171 mg/dL — AB (ref 65–99)
GLUCOSE-CAPILLARY: 179 mg/dL — AB (ref 65–99)
GLUCOSE-CAPILLARY: 192 mg/dL — AB (ref 65–99)
GLUCOSE-CAPILLARY: 197 mg/dL — AB (ref 65–99)
GLUCOSE-CAPILLARY: 248 mg/dL — AB (ref 65–99)
GLUCOSE-CAPILLARY: 307 mg/dL — AB (ref 65–99)
Glucose-Capillary: 204 mg/dL — ABNORMAL HIGH (ref 65–99)
Glucose-Capillary: 191 mg/dL — ABNORMAL HIGH (ref 65–99)
Glucose-Capillary: 167 mg/dL — ABNORMAL HIGH (ref 65–99)
Glucose-Capillary: 160 mg/dL — ABNORMAL HIGH (ref 65–99)
Glucose-Capillary: 273 mg/dL — ABNORMAL HIGH (ref 65–99)

## 2017-03-31 LAB — BASIC METABOLIC PANEL
Anion gap: 13 (ref 5–15)
Anion gap: 12 (ref 5–15)
BUN: 57 mg/dL — AB (ref 6–20)
BUN: 61 mg/dL — AB (ref 6–20)
CALCIUM: 9.1 mg/dL (ref 8.9–10.3)
CHLORIDE: 108 mmol/L (ref 101–111)
CO2: 12 mmol/L — AB (ref 22–32)
CO2: 13 mmol/L — AB (ref 22–32)
CREATININE: 2.16 mg/dL — AB (ref 0.61–1.24)
Calcium: 9 mg/dL (ref 8.9–10.3)
Chloride: 108 mmol/L (ref 101–111)
Creatinine, Ser: 2.15 mg/dL — ABNORMAL HIGH (ref 0.61–1.24)
GFR calc Af Amer: 39 mL/min — ABNORMAL LOW (ref >60)
GFR calc Af Amer: 39 mL/min — ABNORMAL LOW (ref >60)
GFR calc non Af Amer: 33 mL/min — ABNORMAL LOW (ref >60)
GFR, EST NON AFRICAN AMERICAN: 34 mL/min — AB (ref >60)
GLUCOSE: 188 mg/dL — AB (ref 65–99)
GLUCOSE: 189 mg/dL — AB (ref 65–99)
POTASSIUM: 4.1 mmol/L (ref 3.5–5.1)
Potassium: 3.8 mmol/L (ref 3.5–5.1)
Sodium: 133 mmol/L — ABNORMAL LOW (ref 135–145)
Sodium: 133 mmol/L — ABNORMAL LOW (ref 135–145)

## 2017-03-31 LAB — BLOOD GAS, ARTERIAL
Acid-base deficit: 11.9 mmol/L — ABNORMAL HIGH (ref 0.0–2.0)
BICARBONATE: 12.9 mmol/L — AB (ref 20.0–28.0)
Drawn by: 270211
FIO2: 0.21
O2 Saturation: 97.6 %
PH ART: 7.299 — AB (ref 7.350–7.450)
PO2 ART: 97.8 mmHg (ref 83.0–108.0)
Patient temperature: 98.6
pCO2 arterial: 27 mmHg — ABNORMAL LOW (ref 32.0–48.0)

## 2017-03-31 LAB — SODIUM, URINE, RANDOM

## 2017-03-31 LAB — CARBAMAZEPINE LEVEL, TOTAL: Carbamazepine Lvl: 5.1 ug/mL (ref 4.0–12.0)

## 2017-03-31 LAB — HIV ANTIBODY (ROUTINE TESTING W REFLEX): HIV Screen 4th Generation wRfx: NONREACTIVE

## 2017-03-31 LAB — LIPASE, BLOOD: Lipase: 1036 U/L — ABNORMAL HIGH (ref 11–51)

## 2017-03-31 LAB — CREATININE, URINE, RANDOM: CREATININE, URINE: 394.96 mg/dL

## 2017-03-31 MED ORDER — SODIUM CHLORIDE 0.9 % IV SOLN: INTRAVENOUS | Status: DC

## 2017-03-31 MED ORDER — ALBUTEROL SULFATE (2.5 MG/3ML) 0.083% IN NEBU
INHALATION_SOLUTION | RESPIRATORY_TRACT | Status: AC
  Filled 2017-03-31: qty 3

## 2017-03-31 MED ORDER — ONDANSETRON HCL 4 MG/2ML IJ SOLN: 4.0000 mg | Freq: Four times a day (QID) | INTRAMUSCULAR | Status: DC | PRN

## 2017-03-31 MED ORDER — INSULIN ASPART 100 UNIT/ML ~~LOC~~ SOLN
0.0000 [IU] | Freq: Three times a day (TID) | SUBCUTANEOUS | Status: DC
  Administered 2017-03-31: 5 [IU] via SUBCUTANEOUS

## 2017-03-31 MED ORDER — MORPHINE SULFATE (PF) 2 MG/ML IV SOLN: 2.0000 mg | INTRAVENOUS | Status: DC | PRN

## 2017-03-31 MED ORDER — INSULIN ASPART 100 UNIT/ML ~~LOC~~ SOLN
3.0000 [IU] | Freq: Three times a day (TID) | SUBCUTANEOUS | Status: DC
  Administered 2017-03-31 (×2): 3 [IU] via SUBCUTANEOUS

## 2017-03-31 MED ORDER — INSULIN ASPART 100 UNIT/ML ~~LOC~~ SOLN
0.0000 [IU] | Freq: Every day | SUBCUTANEOUS | Status: DC
  Administered 2017-03-31: 3 [IU] via SUBCUTANEOUS

## 2017-03-31 MED ORDER — INSULIN GLARGINE 100 UNIT/ML ~~LOC~~ SOLN
20.0000 [IU] | Freq: Every day | SUBCUTANEOUS | Status: DC
  Administered 2017-03-31: 20 [IU] via SUBCUTANEOUS
  Filled 2017-03-31: qty 0.2

## 2017-03-31 MED ORDER — ONDANSETRON HCL 4 MG/2ML IJ SOLN
4.0000 mg | Freq: Four times a day (QID) | INTRAMUSCULAR | Status: DC | PRN
  Administered 2017-03-31 – 2017-04-02 (×4): 4 mg via INTRAVENOUS
  Filled 2017-03-31 (×4): qty 2

## 2017-03-31 MED ORDER — INSULIN GLARGINE 100 UNIT/ML ~~LOC~~ SOLN
25.0000 [IU] | Freq: Every day | SUBCUTANEOUS | Status: DC
  Administered 2017-04-01: 25 [IU] via SUBCUTANEOUS
  Filled 2017-03-31: qty 0.25

## 2017-03-31 MED ORDER — SODIUM CHLORIDE 0.9 % IV SOLN
INTRAVENOUS | Status: DC
  Administered 2017-03-31 (×2): via INTRAVENOUS

## 2017-03-31 MED ORDER — INSULIN ASPART 100 UNIT/ML ~~LOC~~ SOLN
0.0000 [IU] | Freq: Three times a day (TID) | SUBCUTANEOUS | Status: DC
  Administered 2017-03-31: 7 [IU] via SUBCUTANEOUS

## 2017-03-31 MED ORDER — HYDRALAZINE HCL 20 MG/ML IJ SOLN
10.0000 mg | INTRAMUSCULAR | Status: DC | PRN
Start: 2017-03-31 — End: 2017-04-03
  Filled 2017-03-31: qty 0.5

## 2017-03-31 NOTE — Progress Notes (Addendum)
PROGRESS NOTE    Andre Chandler  [id number] DOB: 02/12/1966 DOA: 03/30/2017 PCP: System, Pcp Not In [facility]    Brief Narrative:  Andre Chandler is a 51 y.o. male with history of diabetes mellitus type 2, seizures, hypertension, Tourette's presents to the ER because of persistent weakness and fatigue last 3-4 days. Patient also has been on some nausea with abdominal discomfort. Denies any vomiting. Patient had stopped his metformin last week due to diarrhea and GI side effects and patient primary care physician was planning to change to Glucotrol. Patient is yet to start the Glucotrol. In the ER patient blood sugars found to be 768 with bicarbonate of 9 and creatinine of 2.1 with markedly elevated gap. Patient was started on fluid bolus and IV insulin infusion for DKA.   Assessment & Plan:   Principal Problem:   DKA (diabetic ketoacidoses) (HCC) Active Problems:   Seizure (HCC)   Essential hypertension   Tourette's   ARF (acute renal failure) (HCC)   Abdominal pain   OSA (obstructive sleep apnea)  DKA -Has recently stopped metformin due to GI side effects, has not yet started glucotrol -DKA likely in setting of pancreatitis  -Blood sugar improved this morning, anion gap closed, transition to subq insulin, lantus, sliding scale -Ha1c pending   Metabolic acidosis -Improving, trend labs   Acute pancreatitis -Elevated lipase with abdominal pain, no CT evidence of pancreatitis (without contrast) -IVF, supportive care, pain management -Check RUQ US, lipid panel  -Clear liquid diet as tolerated   AKI -Unclear baseline Cr -Continue supportive care -Hold lisinopril   Hyponatremia -Improved with treatment of hyperglycemia   Hypertension -BP stable   Hx of seizure  -Continue carbamazepine  Tourette's syndrome -Continue clonidine  Sleep apnea -CPAP qhs    DVT prophylaxis: lovenox Code Status: full Family Communication: wife at bedside Disposition  Plan: pending improvement   Consultants:   none  Procedures:   none  Antimicrobials:  Anti-infectives    None       Subjective: Patient's main complaint this morning is his mid abdominal pain, states that it feels like "it's being eaten inside out." Denies any nausea, vomiting. Has an appetite and wants to try some Jell-O. Denies any chest pain, shortness of breath, fever    Objective: Vitals:   03/31/17 0800 03/31/17 0900 03/31/17 1000 03/31/17 1100  BP: (!) 125/39     Pulse: 77 84 79 76  Resp: 15 19 20 16   Temp: 97.6 F (36.4 C)     TempSrc: Oral     SpO2: 92% 94% 93% 94%  Weight:      Height:        Intake/Output Summary (Last 24 hours) at 03/31/17 1238 Last data filed at 03/31/17 1100  Gross per 24 hour  Intake          4626.16 ml  Output              200 ml  Net          4426.16 ml   Filed Weights   03/30/17 1227 03/30/17 1229  Weight: 131.5 kg (290 lb) 131.5 kg (290 lb)    Examination:  General exam: Appears calm and comfortable  Respiratory system: Clear to auscultation. Respiratory effort normal. Cardiovascular system: S1 & S2 heard, RRR. No JVD, murmurs, rubs, gallops or clicks. No pedal edema. Gastrointestinal system: Abdomen is nondistended, soft and very mildly TTP mid-abdomen. No organomegaly or masses felt.  Central nervous system: Alert and  oriented. No focal neurological deficits. Extremities: Symmetric 5 x 5 power. Skin: No rashes, lesions or ulcers Psychiatry: Judgement and insight appear normal. Mood & affect appropriate.   Data Reviewed: I have personally reviewed following labs and imaging studies  CBC:  Recent Labs Lab 03/30/17 1419  WBC 9.6  NEUTROABS 7.5  HGB 15.8  HCT 45.7  MCV 86.2  PLT 219   Basic Metabolic Panel:  Recent Labs Lab 03/30/17 1419 03/30/17 2221 03/31/17 0128 03/31/17 0730  NA 123* 134* 133* 133*  K 5.9* 4.5 4.1 3.8  CL 94* 107 108 108  CO2 9* 14* 12* 13*  GLUCOSE 768* 214* 188* 189*  BUN 57*  54* 57* 61*  CREATININE 2.16* 1.85* 2.16* 2.15*  CALCIUM 9.7 9.2 9.0 9.1  MG 2.9*  --   --   --    GFR: Estimated Creatinine Clearance: 52.5 mL/min (A) (by C-G formula based on SCr of 2.15 mg/dL (H)). Liver Function Tests:  Recent Labs Lab 03/30/17 1419  AST 34  ALT 62  ALKPHOS 84  BILITOT 1.4*  PROT 8.5*  ALBUMIN 4.0    Recent Labs Lab 03/30/17 1419 03/31/17 0947  LIPASE 242* 1,036*   No results for input(s): AMMONIA in the last 168 hours. Coagulation Profile: No results for input(s): INR, PROTIME in the last 168 hours. Cardiac Enzymes:  Recent Labs Lab 03/30/17 2221  TROPONINI <0.03   BNP (last 3 results) No results for input(s): PROBNP in the last 8760 hours. HbA1C: No results for input(s): HGBA1C in the last 72 hours. CBG:  Recent Labs Lab 03/31/17 0341 03/31/17 0443 03/31/17 0608 03/31/17 0815 03/31/17 0929  GLUCAP 179* 171* 192* 167* 160*   Lipid Profile: No results for input(s): CHOL, HDL, LDLCALC, TRIG, CHOLHDL, LDLDIRECT in the last 72 hours. Thyroid Function Tests: No results for input(s): TSH, T4TOTAL, FREET4, T3FREE, THYROIDAB in the last 72 hours. Anemia Panel: No results for input(s): VITAMINB12, FOLATE, FERRITIN, TIBC, IRON, RETICCTPCT in the last 72 hours. Sepsis Labs: No results for input(s): PROCALCITON, LATICACIDVEN in the last 168 hours.  Recent Results (from the past 240 hour(s))  MRSA PCR Screening     Status: None   Collection Time: 03/30/17  9:20 PM  Result Value Ref Range Status   MRSA by PCR NEGATIVE NEGATIVE Final    Comment:        The GeneXpert MRSA Assay (FDA approved for NASAL specimens only), is one component of a comprehensive MRSA colonization surveillance program. It is not intended to diagnose MRSA infection nor to guide or monitor treatment for MRSA infections.        Radiology Studies: Ct Abdomen Pelvis Wo Contrast  Result Date: 03/30/2017 CLINICAL DATA:  Patient has been having a loss of appetite  one incident of Vomiting. Generalized malaise and nausea. For one week EXAM: CT ABDOMEN AND PELVIS WITHOUT CONTRAST TECHNIQUE: Multidetector CT imaging of the abdomen and pelvis was performed following the standard protocol without IV contrast. COMPARISON:  None. FINDINGS: Lower chest: No acute abnormality. Hepatobiliary: The liver is diffusely low attenuation, compatible with hepatic steatosis. No focal suspicious liver lesions are identified. There are focal areas of fatty sparing. The gallbladder is present. Pancreas: Unremarkable. No pancreatic ductal dilatation or surrounding inflammatory changes. Spleen: Normal in size without focal abnormality. Adrenals/Urinary Tract: Adrenal glands are normal in appearance. No renal mass. No hydronephrosis. No intrarenal stones or ureteral obstruction. Urinary bladder is decompressed. Stomach/Bowel: The stomach and small bowel loops are normal in appearance. Appendix is  not seen. Colon is normal in appearance. Vascular/Lymphatic: No significant vascular findings are present. No enlarged abdominal or pelvic lymph nodes. Reproductive: Minimal prostatic calcifications are present. Otherwise the prostate is normal in size and appearance. Seminal vesicles are normal in appearance. Other: No abdominal wall hernia or abnormality. No abdominopelvic ascites. Musculoskeletal: There is mild degenerative change in the lower thoracic N lumbosacral spine. Disc height loss and vacuum disc at identified at L5-S1. There is 5 mm anterolisthesis of L5 on S1. Bilateral pars defects at L5. No suspicious lytic or blastic lesions are identified. IMPRESSION: 1. Hepatic steatosis. 2. No renal stones or hydronephrosis. 3. Appendix is not well seen but there is no evidence for acute appendicitis. 4. Bilateral pars defects at L5, associated with grade 1 anterolisthesis of L5 on S1. Electronically Signed   By: Norva Pavlov M.D.   On: 03/30/2017 16:38      Scheduled Meds: . aspirin EC  81 mg  Oral Daily  . carbamazepine  600 mg Oral BID  . cholecalciferol  2,000 Units Oral Daily  . cloNIDine  0.1 mg Oral TID  . enoxaparin (LOVENOX) injection  40 mg Subcutaneous Q24H  . ferrous sulfate  325 mg Oral TID  . insulin aspart  0-9 Units Subcutaneous TID WC  . insulin aspart  3 Units Subcutaneous TID WC  . insulin glargine  20 Units Subcutaneous Daily  . montelukast  10 mg Oral QHS  . multivitamin with minerals  1 tablet Oral Daily  . vitamin B-12  5,000 mcg Oral Daily   Continuous Infusions: . sodium chloride       LOS: 1 day    Time spent: 40 minutes   Noralee Stain, DO [facility] [email or web address] Password [facility] 03/31/2017, 12:38 PM

## 2017-03-31 NOTE — Progress Notes (Signed)
RT checked with patient to see if he needed any assistance with his CPAP. Patient stated he was fine. Encouraged patient to call if he needed anything.

## 2017-03-31 NOTE — Progress Notes (Signed)
Pt is on home CPAP. Rt will continue to monitor.

## 2017-04-01 ENCOUNTER — Inpatient Hospital Stay (HOSPITAL_COMMUNITY): Payer: Non-veteran care

## 2017-04-01 LAB — BASIC METABOLIC PANEL
ANION GAP: 11 (ref 5–15)
BUN: 37 mg/dL — ABNORMAL HIGH (ref 6–20)
CHLORIDE: 110 mmol/L (ref 101–111)
CO2: 13 mmol/L — AB (ref 22–32)
Calcium: 9.2 mg/dL (ref 8.9–10.3)
Creatinine, Ser: 1.14 mg/dL (ref 0.61–1.24)
GFR calc non Af Amer: >60 mL/min (ref >60)
GLUCOSE: 294 mg/dL — AB (ref 65–99)
POTASSIUM: 3.8 mmol/L (ref 3.5–5.1)
Sodium: 134 mmol/L — ABNORMAL LOW (ref 135–145)

## 2017-04-01 LAB — HEMOGLOBIN A1C
HEMOGLOBIN A1C: 13.1 % — AB (ref 4.8–5.6)
Mean Plasma Glucose: 329 mg/dL

## 2017-04-01 LAB — LIPID PANEL
CHOLESTEROL: 174 mg/dL (ref 0–200)
HDL: 62 mg/dL (ref >40)
LDL CALC: 90 mg/dL (ref 0–99)
Total CHOL/HDL Ratio: 2.8 RATIO
Triglycerides: 112 mg/dL (ref <150)
VLDL: 22 mg/dL (ref 0–40)

## 2017-04-01 LAB — GLUCOSE, CAPILLARY
GLUCOSE-CAPILLARY: 252 mg/dL — AB (ref 65–99)
Glucose-Capillary: 318 mg/dL — ABNORMAL HIGH (ref 65–99)
Glucose-Capillary: 291 mg/dL — ABNORMAL HIGH (ref 65–99)

## 2017-04-01 MED ORDER — INSULIN GLARGINE 100 UNIT/ML ~~LOC~~ SOLN
30.0000 [IU] | Freq: Every day | SUBCUTANEOUS | Status: DC
  Administered 2017-04-02: 30 [IU] via SUBCUTANEOUS
  Filled 2017-04-01: qty 0.3

## 2017-04-01 MED ORDER — INSULIN STARTER KIT- SYRINGES (ENGLISH)
1.0000 | Freq: Once | Status: AC
  Administered 2017-04-02: 1
  Filled 2017-04-01: qty 1

## 2017-04-01 MED ORDER — INSULIN ASPART 100 UNIT/ML ~~LOC~~ SOLN
0.0000 [IU] | SUBCUTANEOUS | Status: DC
  Administered 2017-04-01: 8 [IU] via SUBCUTANEOUS
  Administered 2017-04-01: 11 [IU] via SUBCUTANEOUS
  Administered 2017-04-01: 5 [IU] via SUBCUTANEOUS
  Administered 2017-04-01: 8 [IU] via SUBCUTANEOUS
  Administered 2017-04-02: 5 [IU] via SUBCUTANEOUS
  Administered 2017-04-02: 8 [IU] via SUBCUTANEOUS
  Administered 2017-04-02: 5 [IU] via SUBCUTANEOUS

## 2017-04-01 NOTE — Progress Notes (Addendum)
PROGRESS NOTE    Andre Chandler  [id number] DOB: 12/10/1965 DOA: 03/30/2017 PCP: System, Pcp Not In [facility]    Brief Narrative:  Andre Chandler is a 51 y.o. male with history of diabetes mellitus type 2, seizures, hypertension, Tourette's presents to the ER because of persistent weakness and fatigue last 3-4 days. Patient also has been on some nausea with abdominal discomfort. Denies any vomiting. Patient had stopped his metformin last week due to diarrhea and GI side effects and patient primary care physician was planning to change to Glucotrol. Patient is yet to start the Glucotrol. In the ER patient blood sugars found to be 768 with bicarbonate of 9 and creatinine of 2.1 with markedly elevated gap. Patient was started on fluid bolus and IV insulin infusion for DKA.   Assessment & Plan:   Principal Problem:   DKA (diabetic ketoacidoses) (HCC) Active Problems:   Seizure (HCC)   Essential hypertension   Tourette's   ARF (acute renal failure) (HCC)   Abdominal pain   OSA (obstructive sleep apnea)  DKA -Has recently stopped metformin due to GI side effects, has not yet started glucotrol -DKA likely in setting of pancreatitis  -Anion gap closed, transition to subq insulin, lantus, sliding scale  -Ha1c 13.1. He will need insulin at discharge. Diabetic coordinator consulted   Metabolic acidosis -Improved, trend labs   Acute pancreatitis -Elevated lipase with abdominal pain, no CT evidence of pancreatitis (without contrast) -IVF, supportive care, pain management -RUQ US limited due to body habitus, gallbladder not visualized  -Check lipid panel  -Clear liquid diet as tolerated   AKI -Unclear baseline Cr -Hold lisinopril  -Resolved this morning   Hyponatremia -Improved with treatment of hyperglycemia   Hypertension -BP stable   Hx of seizure  -Continue carbamazepine  Tourette's syndrome -Continue clonidine  Sleep apnea -CPAP qhs   Morbid  obesity -Body mass index is 45.42 kg/m.   DVT prophylaxis: lovenox Code Status: full Family Communication: mom at bedside Disposition Plan: pending improvement   Consultants:   none  Procedures:   none  Antimicrobials:  Anti-infectives    None       Subjective: Doing better this morning. Abdominal pain is now intermittent in nature, had small amount of emesis 1 time this morning, but tolerated jello and pudding yesterday. Awaiting RUQ US    Objective: Vitals:   03/31/17 1300 03/31/17 1318 03/31/17 2017 04/01/17 0624  BP:  (!) 109/56 137/67 133/88  Pulse: 79 75 76 84  Resp: 17 18 19 18   Temp:  98.1 F (36.7 C) 98 F (36.7 C) 97.6 F (36.4 C)  TempSrc:  Oral Oral Oral  SpO2: 95% 99% 99% 100%  Weight:      Height:        Intake/Output Summary (Last 24 hours) at 04/01/17 1147 Last data filed at 03/31/17 1300  Gross per 24 hour  Intake               10 ml  Output              100 ml  Net              -90 ml   Filed Weights   03/30/17 1227 03/30/17 1229  Weight: 131.5 kg (290 lb) 131.5 kg (290 lb)    Examination:  General exam: Appears calm and comfortable  Respiratory system: Clear to auscultation. Respiratory effort normal. Cardiovascular system: S1 & S2 heard, RRR. No JVD, murmurs, rubs, gallops  or clicks. No pedal edema. Gastrointestinal system: Abdomen is nondistended, soft and very mildly TTP mid-abdomen. No organomegaly or masses felt.  Central nervous system: Alert and oriented. No focal neurological deficits. Extremities: Symmetric 5 x 5 power. Skin: No rashes, lesions or ulcers Psychiatry: Judgement and insight appear normal. Mood & affect appropriate.   Data Reviewed: I have personally reviewed following labs and imaging studies  CBC:  Recent Labs Lab 03/30/17 1419  WBC 9.6  NEUTROABS 7.5  HGB 15.8  HCT 45.7  MCV 86.2  PLT 219   Basic Metabolic Panel:  Recent Labs Lab 03/30/17 1419 03/30/17 2221 03/31/17 0128 03/31/17 0730  04/01/17 0414  NA 123* 134* 133* 133* 134*  K 5.9* 4.5 4.1 3.8 3.8  CL 94* 107 108 108 110  CO2 9* 14* 12* 13* 13*  GLUCOSE 768* 214* 188* 189* 294*  BUN 57* 54* 57* 61* 37*  CREATININE 2.16* 1.85* 2.16* 2.15* 1.14  CALCIUM 9.7 9.2 9.0 9.1 9.2  MG 2.9*  --   --   --   --    GFR: Estimated Creatinine Clearance: 99 mL/min (by C-G formula based on SCr of 1.14 mg/dL). Liver Function Tests:  Recent Labs Lab 03/30/17 1419  AST 34  ALT 62  ALKPHOS 84  BILITOT 1.4*  PROT 8.5*  ALBUMIN 4.0    Recent Labs Lab 03/30/17 1419 03/31/17 0947  LIPASE 242* 1,036*   No results for input(s): AMMONIA in the last 168 hours. Coagulation Profile: No results for input(s): INR, PROTIME in the last 168 hours. Cardiac Enzymes:  Recent Labs Lab 03/30/17 2221  TROPONINI <0.03   BNP (last 3 results) No results for input(s): PROBNP in the last 8760 hours. HbA1C:  Recent Labs  03/31/17 0128  HGBA1C 13.1*   CBG:  Recent Labs Lab 03/31/17 0929 03/31/17 1247 03/31/17 1654 03/31/17 2040 04/01/17 0726  GLUCAP 160* 307* 248* 273* 318*   Lipid Profile: No results for input(s): CHOL, HDL, LDLCALC, TRIG, CHOLHDL, LDLDIRECT in the last 72 hours. Thyroid Function Tests: No results for input(s): TSH, T4TOTAL, FREET4, T3FREE, THYROIDAB in the last 72 hours. Anemia Panel: No results for input(s): VITAMINB12, FOLATE, FERRITIN, TIBC, IRON, RETICCTPCT in the last 72 hours. Sepsis Labs: No results for input(s): PROCALCITON, LATICACIDVEN in the last 168 hours.  Recent Results (from the past 240 hour(s))  MRSA PCR Screening     Status: None   Collection Time: 03/30/17  9:20 PM  Result Value Ref Range Status   MRSA by PCR NEGATIVE NEGATIVE Final    Comment:        The GeneXpert MRSA Assay (FDA approved for NASAL specimens only), is one component of a comprehensive MRSA colonization surveillance program. It is not intended to diagnose MRSA infection nor to guide or monitor treatment  for MRSA infections.        Radiology Studies: Ct Abdomen Pelvis Wo Contrast  Result Date: 03/30/2017 CLINICAL DATA:  Patient has been having a loss of appetite one incident of Vomiting. Generalized malaise and nausea. For one week EXAM: CT ABDOMEN AND PELVIS WITHOUT CONTRAST TECHNIQUE: Multidetector CT imaging of the abdomen and pelvis was performed following the standard protocol without IV contrast. COMPARISON:  None. FINDINGS: Lower chest: No acute abnormality. Hepatobiliary: The liver is diffusely low attenuation, compatible with hepatic steatosis. No focal suspicious liver lesions are identified. There are focal areas of fatty sparing. The gallbladder is present. Pancreas: Unremarkable. No pancreatic ductal dilatation or surrounding inflammatory changes. Spleen: Normal in size  without focal abnormality. Adrenals/Urinary Tract: Adrenal glands are normal in appearance. No renal mass. No hydronephrosis. No intrarenal stones or ureteral obstruction. Urinary bladder is decompressed. Stomach/Bowel: The stomach and small bowel loops are normal in appearance. Appendix is not seen. Colon is normal in appearance. Vascular/Lymphatic: No significant vascular findings are present. No enlarged abdominal or pelvic lymph nodes. Reproductive: Minimal prostatic calcifications are present. Otherwise the prostate is normal in size and appearance. Seminal vesicles are normal in appearance. Other: No abdominal wall hernia or abnormality. No abdominopelvic ascites. Musculoskeletal: There is mild degenerative change in the lower thoracic N lumbosacral spine. Disc height loss and vacuum disc at identified at L5-S1. There is 5 mm anterolisthesis of L5 on S1. Bilateral pars defects at L5. No suspicious lytic or blastic lesions are identified. IMPRESSION: 1. Hepatic steatosis. 2. No renal stones or hydronephrosis. 3. Appendix is not well seen but there is no evidence for acute appendicitis. 4. Bilateral pars defects at L5,  associated with grade 1 anterolisthesis of L5 on S1. Electronically Signed   By: Norva Pavlov M.D.   On: 03/30/2017 16:38   US Abdomen Limited Ruq  Result Date: 04/01/2017 CLINICAL DATA:  Pancreatitis. EXAM: ULTRASOUND ABDOMEN LIMITED RIGHT UPPER QUADRANT COMPARISON:  None. FINDINGS: Gallbladder: Gallbladder is not able to be seen, presumably related to patient body habitus and/or obscuration by overlying bowel. Common bile duct: Diameter: Poorly seen. Small visible portion measured at 5 mm diameter. Liver: Diffusely echogenic indicating fatty infiltration. No focal lesion is seen within the liver although characterization is limited by the underlying fatty infiltration. IMPRESSION: 1. Very limited study due to patient body habitus. Gallbladder is not able to be seen, presumably related to the body habitus and/or obscuration by overlying bowel. 2. Majority of the common bile duct is not seen. Small visible portion appears normal measuring 5 mm diameter. 3. Fatty infiltration of the liver. Electronically Signed   By: Bary Richard M.D.   On: 04/01/2017 10:53      Scheduled Meds: . aspirin EC  81 mg Oral Daily  . carbamazepine  600 mg Oral BID  . cholecalciferol  2,000 Units Oral Daily  . cloNIDine  0.1 mg Oral TID  . enoxaparin (LOVENOX) injection  40 mg Subcutaneous Q24H  . ferrous sulfate  325 mg Oral TID  . insulin aspart  0-15 Units Subcutaneous Q4H  . [START ON 04/02/2017] insulin glargine  30 Units Subcutaneous Daily  . montelukast  10 mg Oral QHS  . multivitamin with minerals  1 tablet Oral Daily  . vitamin B-12  5,000 mcg Oral Daily   Continuous Infusions: . sodium chloride       LOS: 2 days    Time spent: 30 minutes   Noralee Stain, DO [facility] [email or web address] Password [email or web address] 04/01/2017, 11:47 AM

## 2017-04-01 NOTE — Progress Notes (Signed)
Pt. CBG's trending down. Able to tolerate clear liquids qith c/o of n/v. Pt oob to shower. Tolerated well. Cont with current plan of care

## 2017-04-01 NOTE — Progress Notes (Signed)
Inpatient Diabetes Program Recommendations  AACE/ADA: New Consensus Statement on Inpatient Glycemic Control (2015)  Target Ranges:  Prepandial:   less than 140 mg/dL      Peak postprandial:   less than 180 mg/dL (1-2 hours)      Critically ill patients:  140 - 180 mg/dL   Results for Andre Chandler, [name] (MRN [id number]) as of 04/01/2017 17:15  Ref. Range 03/31/2017 08:15 03/31/2017 09:29 03/31/2017 12:47 03/31/2017 16:54 03/31/2017 20:40  Glucose-Capillary Latest Ref Range: 65 - 99 mg/dL 829167 (H) 562160 (H) 130307 (H) 248 (H) 273 (H)   Results for Andre Chandler, [name] (MRN [id number]) as of 04/01/2017 17:15  Ref. Range 04/01/2017 07:26 04/01/2017 12:22  Glucose-Capillary Latest Ref Range: 65 - 99 mg/dL 295318 (H) 284291 (H)    Results for Andre Chandler, [name] (MRN [id number]) as of 04/01/2017 17:15  Ref. Range 03/31/2017 01:28  Hemoglobin A1C Latest Ref Range: 4.8 - 5.6 % 13.1 (H)    Admit with: DKA and Pancreatitis  History: Type 2 DM, Tourette's   Home DM Meds: Metformin (stopped 1 week ago due to GI side effects)  Current Insulin Orders: Lantus 30 units daily      Novolog Moderate Correction Scale/ SSI (0-15 units) Q4 hours       MD- Note patient transitioned off IV Insulin drip yesterday AM to Lantus and Novolog.  Concerned that patient still has low CO2 level (was 13 mmol/L today on 4am BMET) even though Anion Gap is WNL.  Glucose levels still quite high as well.  Considering pt's current lipase of 1036 U/L and current glucose levels, should we place patient back on the IV Insulin drip?     --Will follow patient during hospitalization--  Ambrose FinlandJeannine Johnston Bela Nyborg RN, MSN, CDE Diabetes Coordinator Inpatient Glycemic Control Team Team Pager: [phone] (8a-5p)

## 2017-04-02 LAB — GLUCOSE, CAPILLARY
GLUCOSE-CAPILLARY: 208 mg/dL — AB (ref 65–99)
GLUCOSE-CAPILLARY: 237 mg/dL — AB (ref 65–99)
GLUCOSE-CAPILLARY: 188 mg/dL — AB (ref 65–99)
GLUCOSE-CAPILLARY: 180 mg/dL — AB (ref 65–99)
Glucose-Capillary: 248 mg/dL — ABNORMAL HIGH (ref 65–99)
Glucose-Capillary: 228 mg/dL — ABNORMAL HIGH (ref 65–99)

## 2017-04-02 LAB — BASIC METABOLIC PANEL
Anion gap: 10 (ref 5–15)
BUN: 17 mg/dL (ref 6–20)
CHLORIDE: 109 mmol/L (ref 101–111)
CO2: 17 mmol/L — AB (ref 22–32)
CREATININE: 0.82 mg/dL (ref 0.61–1.24)
Calcium: 9.5 mg/dL (ref 8.9–10.3)
GFR calc Af Amer: >60 mL/min (ref >60)
GFR calc non Af Amer: >60 mL/min (ref >60)
GLUCOSE: 228 mg/dL — AB (ref 65–99)
POTASSIUM: 3.5 mmol/L (ref 3.5–5.1)
Sodium: 136 mmol/L (ref 135–145)

## 2017-04-02 MED ORDER — INSULIN ASPART 100 UNIT/ML ~~LOC~~ SOLN
0.0000 [IU] | Freq: Three times a day (TID) | SUBCUTANEOUS | Status: DC
  Administered 2017-04-02: 4 [IU] via SUBCUTANEOUS
  Administered 2017-04-02: 7 [IU] via SUBCUTANEOUS
  Administered 2017-04-03: 4 [IU] via SUBCUTANEOUS

## 2017-04-02 MED ORDER — INSULIN ASPART 100 UNIT/ML ~~LOC~~ SOLN
3.0000 [IU] | Freq: Three times a day (TID) | SUBCUTANEOUS | Status: DC
  Administered 2017-04-02: 3 [IU] via SUBCUTANEOUS

## 2017-04-02 MED ORDER — INSULIN GLARGINE 100 UNIT/ML ~~LOC~~ SOLN
35.0000 [IU] | Freq: Every day | SUBCUTANEOUS | Status: DC
  Administered 2017-04-03: 35 [IU] via SUBCUTANEOUS
  Filled 2017-04-02: qty 0.35

## 2017-04-02 MED ORDER — INSULIN ASPART 100 UNIT/ML ~~LOC~~ SOLN: 0.0000 [IU] | Freq: Every day | SUBCUTANEOUS | Status: DC

## 2017-04-02 MED ORDER — INSULIN ASPART 100 UNIT/ML ~~LOC~~ SOLN
6.0000 [IU] | Freq: Three times a day (TID) | SUBCUTANEOUS | Status: DC
  Administered 2017-04-02 – 2017-04-03 (×2): 6 [IU] via SUBCUTANEOUS

## 2017-04-02 MED ORDER — LISINOPRIL 20 MG PO TABS
20.0000 mg | ORAL_TABLET | Freq: Every day | ORAL | Status: DC
  Administered 2017-04-02 – 2017-04-03 (×2): 20 mg via ORAL
  Filled 2017-04-02 (×2): qty 1

## 2017-04-02 MED ORDER — PANTOPRAZOLE SODIUM 20 MG PO TBEC
20.0000 mg | DELAYED_RELEASE_TABLET | Freq: Two times a day (BID) | ORAL | Status: DC
  Administered 2017-04-02 – 2017-04-03 (×2): 20 mg via ORAL
  Filled 2017-04-02 (×2): qty 1

## 2017-04-02 NOTE — Progress Notes (Addendum)
PROGRESS NOTE    Haiden Rawlinson  HYQ:[id number] DOB: 1966/01/26 DOA: 03/30/2017 PCP: System, Pcp Not In Follows with [place]    Brief Narrative:  Sherry Rogus is a 51 y.o. male with history of diabetes mellitus type 2, seizures, hypertension, Tourette's presents to the ER because of persistent weakness and fatigue last 3-4 days. Patient also has been on some nausea with abdominal discomfort. Denies any vomiting. Patient had stopped his metformin last week due to diarrhea and GI side effects and patient primary care physician was planning to change to Glucotrol. Patient is yet to start the Glucotrol. In the ER patient blood sugars found to be 768 with bicarbonate of 9 and creatinine of 2.1 with markedly elevated gap. Patient was started on fluid bolus and IV insulin infusion for DKA.   Assessment & Plan:   Principal Problem:   DKA (diabetic ketoacidoses) ([place]) Active Problems:   Seizure ([place])   Essential hypertension   Tourette's   ARF (acute renal failure) ([facility])   Abdominal pain   OSA (obstructive sleep apnea)  DKA -Has recently stopped metformin due to GI side effects, has not yet started glucotrol -DKA likely in setting of pancreatitis. Initially treated with IV insulin. Anion gap closed, transition to subq insulin, lantus, sliding scale - dose adjusted today  -Ha1c 13.1. He will need insulin at discharge. Diabetic coordinator and CM consulted   Metabolic acidosis -Improved, trend labs   Acute pancreatitis -Elevated lipase with abdominal pain, no CT evidence of pancreatitis (without contrast) -RUQ [place] limited due to body habitus, gallbladder not visualized  -TG 112 -IVF, supportive care, pain management  -Improving in symptoms, no further vomiting and abdominal pain resolved. Advance to full liquid diet    AKI -Resolved. Resume lisinopril   Hyponatremia -Resolved   Hypertension -Resume lisinopril   Hx of seizure  -Continue carbamazepine  Tourette's  syndrome -Continue clonidine  Sleep apnea -CPAP qhs   GERD -Start PPI   Morbid obesity -Body mass index is 45.42 kg/m.   DVT prophylaxis: lovenox Code Status: full Family Communication: mom at bedside Disposition Plan: pending improvement   Consultants:   none  Procedures:   none  Antimicrobials:  Anti-infectives    None       Subjective: Doing better this morning. Abdominal pain now resolved, tolerating clear liquids yesterday without vomiting. Wants to try eggs today. Has morning reflux symptoms    Objective: Vitals:   04/01/17 0624 04/01/17 1510 04/01/17 1928 04/02/17 0433  BP: 133/88 (!) 137/56 (!) 142/76 (!) 157/75  Pulse: 84 78 80 78  Resp: 18 18 18 18   Temp: 97.6 F (36.4 C) (!) 97.5 F (36.4 C) 98 F (36.7 C) 98.2 F (36.8 C)  TempSrc: Oral Oral Oral Oral  SpO2: 100% 100% 99% 97%  Weight:      Height:        Intake/Output Summary (Last 24 hours) at 04/02/17 1015 Last data filed at 04/02/17 0600  Gross per 24 hour  Intake              480 ml  Output                0 ml  Net              480 ml   Filed Weights   03/30/17 1227 03/30/17 1229  Weight: 131.5 kg (290 lb) 131.5 kg (290 lb)    Examination:  General exam: Appears calm and comfortable  Respiratory system: Clear to  auscultation. Respiratory effort normal. Cardiovascular system: S1 & S2 heard, RRR. No JVD, murmurs, rubs, gallops or clicks. No pedal edema. Gastrointestinal system: Abdomen is nondistended, soft, non tender to palpation. No organomegaly or masses felt.  Central nervous system: Alert and oriented. No focal neurological deficits. Extremities: Symmetric 5 x 5 power. Skin: No rashes, lesions or ulcers Psychiatry: Judgement and insight appear normal. Mood & affect appropriate.   Data Reviewed: I have personally reviewed following labs and imaging studies  CBC:  Recent Labs Lab 03/30/17 1419  WBC 9.6  NEUTROABS 7.5  HGB 15.8  HCT 45.7  MCV 86.2  PLT 163    Basic Metabolic Panel:  Recent Labs Lab 03/30/17 1419 03/30/17 2221 03/31/17 0128 03/31/17 0730 04/01/17 0414 04/02/17 0456  NA 123* 134* 133* 133* 134* 136  K 5.9* 4.5 4.1 3.8 3.8 3.5  CL 94* 107 108 108 110 109  CO2 9* 14* 12* 13* 13* 17*  GLUCOSE 768* 214* 188* 189* 294* 228*  BUN 57* 54* 57* 61* 37* 17  CREATININE 2.16* 1.85* 2.16* 2.15* 1.14 0.82  CALCIUM 9.7 9.2 9.0 9.1 9.2 9.5  MG 2.9*  --   --   --   --   --    GFR: Estimated Creatinine Clearance: 137.6 mL/min (by C-G formula based on SCr of 0.82 mg/dL). Liver Function Tests:  Recent Labs Lab 03/30/17 1419  AST 34  ALT 62  ALKPHOS 84  BILITOT 1.4*  PROT 8.5*  ALBUMIN 4.0    Recent Labs Lab 03/30/17 1419 03/31/17 0947  LIPASE 242* 1,036*   No results for input(s): AMMONIA in the last 168 hours. Coagulation Profile: No results for input(s): INR, PROTIME in the last 168 hours. Cardiac Enzymes:  Recent Labs Lab 03/30/17 2221  TROPONINI <0.03   BNP (last 3 results) No results for input(s): PROBNP in the last 8760 hours. HbA1C:  Recent Labs  03/31/17 0128  HGBA1C 13.1*   CBG:  Recent Labs Lab 04/01/17 1222 04/01/17 1734 04/01/17 1923 04/02/17 0430 04/02/17 0751  GLUCAP 291* 252* 248* 208* 228*   Lipid Profile:  Recent Labs  04/01/17 0414  CHOL 174  HDL 62  LDLCALC 90  TRIG 112  CHOLHDL 2.8   Thyroid Function Tests: No results for input(s): TSH, T4TOTAL, FREET4, T3FREE, THYROIDAB in the last 72 hours. Anemia Panel: No results for input(s): VITAMINB12, FOLATE, FERRITIN, TIBC, IRON, RETICCTPCT in the last 72 hours. Sepsis Labs: No results for input(s): PROCALCITON, LATICACIDVEN in the last 168 hours.  Recent Results (from the past 240 hour(s))  MRSA PCR Screening     Status: None   Collection Time: 03/30/17  9:20 PM  Result Value Ref Range Status   MRSA by PCR NEGATIVE NEGATIVE Final    Comment:        The GeneXpert MRSA Assay (FDA approved for NASAL specimens only),  is one component of a comprehensive MRSA colonization surveillance program. It is not intended to diagnose MRSA infection nor to guide or monitor treatment for MRSA infections.        Radiology Studies: US Abdomen Limited Ruq  Result Date: 04/01/2017 CLINICAL DATA:  Pancreatitis. EXAM: ULTRASOUND ABDOMEN LIMITED RIGHT UPPER QUADRANT COMPARISON:  None. FINDINGS: Gallbladder: Gallbladder is not able to be seen, presumably related to patient body habitus and/or obscuration by overlying bowel. Common bile duct: Diameter: Poorly seen. Small visible portion measured at 5 mm diameter. Liver: Diffusely echogenic indicating fatty infiltration. No focal lesion is seen within the liver although characterization  is limited by the underlying fatty infiltration. IMPRESSION: 1. Very limited study due to patient body habitus. Gallbladder is not able to be seen, presumably related to the body habitus and/or obscuration by overlying bowel. 2. Majority of the common bile duct is not seen. Small visible portion appears normal measuring 5 mm diameter. 3. Fatty infiltration of the liver. Electronically Signed   By: Franki Cabot M.D.   On: 04/01/2017 10:53      Scheduled Meds: . aspirin EC  81 mg Oral Daily  . carbamazepine  600 mg Oral BID  . cholecalciferol  2,000 Units Oral Daily  . cloNIDine  0.1 mg Oral TID  . enoxaparin (LOVENOX) injection  40 mg Subcutaneous Q24H  . ferrous sulfate  325 mg Oral TID  . insulin aspart  0-20 Units Subcutaneous TID WC  . insulin aspart  0-5 Units Subcutaneous QHS  . insulin aspart  3 Units Subcutaneous TID WC  . insulin glargine  30 Units Subcutaneous Daily  . insulin starter kit- syringes  1 kit Other Once  . lisinopril  20 mg Oral Daily  . montelukast  10 mg Oral QHS  . multivitamin with minerals  1 tablet Oral Daily  . pantoprazole  20 mg Oral BID  . vitamin B-12  5,000 mcg Oral Daily   Continuous Infusions:    LOS: 3 days    Time spent: 30 minutes    Dessa Phi, DO [facility] [email or web address] Password [email or web address] 04/02/2017, 10:15 AM

## 2017-04-02 NOTE — Progress Notes (Signed)
[facility] [place] and [facility] be able [facility]. He will need medication  Prescriptions.

## 2017-04-02 NOTE — Progress Notes (Signed)
Inpatient Diabetes Program Recommendations  AACE/ADA: New Consensus Statement on Inpatient Glycemic Control (2015)  Target Ranges:  Prepandial:   less than 140 mg/dL      Peak postprandial:   less than 180 mg/dL (1-2 hours)      Critically ill patients:  140 - 180 mg/dL   Lab Results  Component Value Date   GLUCAP 228 (H) 04/02/2017   HGBA1C 13.1 (H) 03/31/2017    Review of Glycemic Control  Briefly spoke to pt and mother regarding his diabetes control. Pt states no problem in getting his insulin and supplies [facility] [place]. Pt prefers insulin pen instead of syringe. Encouraged pt to stick finger for CBGs while in hospital. RN will also allow pt to give his own insulin (via syringe) while inpatient. Will teach insulin pen administration this afternoon. Emphasized importance of lifestyle changes with diet, exercise, and stress management. Pt drinks regular soda and eats large portions. Admits to recent weight loss.   Blood sugars continue in 200s.  Will likely adjust insulin daily.  Continue to follow. Will have further discussion this afternoon and teach insulin administration.   Consider addition of Novolog 6 units tidwc for meal coverage insulin.    Thank you. Ailene Ardshonda Jarrah Babich, RD, LDN, CDE Inpatient Diabetes Coordinator [phone]

## 2017-04-02 NOTE — Progress Notes (Signed)
Inpatient Diabetes Program Recommendations  AACE/ADA: New Consensus Statement on Inpatient Glycemic Control (2015)  Target Ranges:  Prepandial:   less than 140 mg/dL      Peak postprandial:   less than 180 mg/dL (1-2 hours)      Critically ill patients:  140 - 180 mg/dL   Lab Results  Component Value Date   GLUCAP 237 (H) 04/02/2017   HGBA1C 13.1 (H) 03/31/2017    Review of Glycemic Control  Pt states he's feeling a little better. Tolerating [place] diet. Demonstrated insulin pen administration.  Reviewed all steps if insulin pen including attachment of needle, 2-unit air shot, dialing up dose, giving injection, removing needle, disposal of sharps, storage of unused insulin, disposal of insulin etc  Pt asked to return demonstration at later time.  Answered questions.  Will follow-up in am. Continue to educate.  Thank you. Ailene Ardshonda Julene Rahn, RD, LDN, CDE Inpatient Diabetes Coordinator [phone]

## 2017-04-03 LAB — BASIC METABOLIC PANEL
Anion gap: 12 (ref 5–15)
BUN: 12 mg/dL (ref 6–20)
CALCIUM: 9.5 mg/dL (ref 8.9–10.3)
CHLORIDE: 104 mmol/L (ref 101–111)
CO2: 18 mmol/L — AB (ref 22–32)
CREATININE: 0.73 mg/dL (ref 0.61–1.24)
GFR calc non Af Amer: >60 mL/min (ref >60)
GLUCOSE: 233 mg/dL — AB (ref 65–99)
Potassium: 3.6 mmol/L (ref 3.5–5.1)
Sodium: 134 mmol/L — ABNORMAL LOW (ref 135–145)

## 2017-04-03 LAB — GLUCOSE, CAPILLARY: Glucose-Capillary: 228 mg/dL — ABNORMAL HIGH (ref 65–99)

## 2017-04-03 MED ORDER — PANTOPRAZOLE SODIUM 20 MG PO TBEC: 20.0000 mg | DELAYED_RELEASE_TABLET | Freq: Two times a day (BID) | ORAL | 0 refills | Status: AC

## 2017-04-03 MED ORDER — INSULIN ASPART 100 UNIT/ML FLEXPEN: PEN_INJECTOR | SUBCUTANEOUS | 0 refills | Status: AC

## 2017-04-03 MED ORDER — INSULIN GLARGINE 100 UNIT/ML SOLOSTAR PEN: 35.0000 [IU] | PEN_INJECTOR | Freq: Every day | SUBCUTANEOUS | 0 refills | Status: AC

## 2017-04-03 MED ORDER — BLOOD GLUCOSE METER KIT: PACK | 0 refills | Status: AC

## 2017-04-03 MED ORDER — INSULIN PEN NEEDLE 31G X 5 MM MISC: 0 refills | Status: AC

## 2017-04-03 NOTE — Progress Notes (Signed)
Pt. remains off home CPAP, wife at bedside, made aware to notify if help needed.

## 2017-04-03 NOTE — Progress Notes (Signed)
  RD consulted for nutrition education regarding diabetes.   Lab Results  Component Value Date   HGBA1C 13.1 (H) 03/31/2017    RD provided "Carbohydrate Counting for People with Diabetes" handout from the Academy of Nutrition and Dietetics. Discussed different food groups and their effects on blood sugar, emphasizing carbohydrate-containing foods. Provided list of carbohydrates and recommended serving sizes of common foods.  Discussed importance of controlled and consistent carbohydrate intake throughout the day. Provided examples of ways to balance meals/snacks and encouraged intake of high-fiber, whole grain complex carbohydrates. Teach back method used. Pt's wife diabetic, she seems well versed in eating for diabetes. Pt has strong support system.   Expect good compliance.  Body mass index is 45.42 kg/m. Pt meets criteria for obese based on current BMI.  Current diet order is full liquid, patient is consuming approximately 70% of meals at this time. Labs and medications reviewed. No further nutrition interventions warranted at this time. RD contact information provided. If additional nutrition issues arise, please re-consult RD.  Vanessa Kickarly Rucker Pridgeon RD, LDN Clinical Nutrition Pager # [phone]- [phone]

## 2017-04-03 NOTE — Discharge Instructions (Signed)
Check your blood sugar 4 times daily: before breakfast, before lunch, before dinner, before sleep. You may also check throughout the day if you feel that your blood sugar is too low or too high.   Lantus is your long-acting insulin. This should be used at the same time each day. Start with 35 units daily.  Novolog is your short-acting correction insulin. Check your blood sugar prior to your meal. Then depending on your blood sugar, inject 6 units with each meal PLUS correction factor follows: CBG 121 - 150: 3 units  CBG 151 - 200: 4 units  CBG 201 - 250: 7 units  CBG 251 - 300: 11 units  CBG 301 - 350: 15 units  CBG 351 - 400: 20 units   Keep a journal of your blood sugar results each day. Bring to your primary care physician.     Acute Pancreatitis Acute pancreatitis is a condition in which the pancreas suddenly gets irritated and swollen (has inflammation). The pancreas is a large gland behind the stomach. It makes enzymes that help to digest food. The pancreas also makes hormones that help to control your blood sugar. Acute pancreatitis happens when the enzymes attack the pancreas and damage it. Most attacks last a couple of days and can cause serious problems. Follow these instructions at home: Eating and drinking  Follow instructions from your doctor about diet. You may need to: ? Avoid alcohol. ? Limit how much fat is in your diet.  Eat small meals often. Avoid eating big meals.  Drink enough fluid to keep your pee (urine) clear or pale yellow.  Do not drink alcohol if it caused your condition. General instructions  Take over-the-counter and prescription medicines only as told by your doctor.  Do not use any tobacco products. These include cigarettes, chewing tobacco, and e-cigarettes. If you need help quitting, ask your doctor.  Get plenty of rest.  If directed, check your blood sugar at home as told by your doctor.  Keep all follow-up visits as told by your doctor.  This is important. Contact a doctor if:  You do not get better as quickly as expected.  You have new symptoms.  Your symptoms get worse.  You have lasting pain or weakness.  You continue to feel sick to your stomach (nauseous).  You get better and then you have another pain attack.  You have a fever. Get help right away if:  You cannot eat or keep fluids down.  Your pain becomes very bad.  Your skin or the white part of your eyes turns yellow (jaundice).  You throw up (vomit).  You feel dizzy or you pass out (faint).  Your blood sugar is high (over 300 mg/dL). This information is not intended to replace advice given to you by your health care provider. Make sure you discuss any questions you have with your health care provider. Document Released: 01/31/2008 Document Revised: 01/20/2016 Document Reviewed: 05/18/2015 Elsevier Interactive Patient Education  2018 [employer].   Soft-Food Meal Plan A soft-food meal plan includes foods that are safe and easy to swallow. This meal plan typically is used:  If you are having trouble chewing or swallowing foods.  As a transition meal plan after only having had liquid meals for a long period.  What do I need to know about the soft-food meal plan? A soft-food meal plan includes tender foods that are soft and easy to chew and swallow. In most cases, bite-sized pieces of food are  easier to swallow. A bite-sized piece is about  inch or smaller. Foods in this plan do not need to be ground or pureed. Foods that are very hard, crunchy, or sticky should be avoided. Also, breads, cereals, yogurts, and desserts with nuts, seeds, or fruits should be avoided. What foods can I eat? Grains Rice and wild rice. Moist bread, dressing, pasta, and noodles. Well-moistened dry or cooked cereals, such as farina (cooked wheat cereal), oatmeal, or grits. Biscuits, breads, muffins, pancakes, and waffles that have been well  moistened. Vegetables Shredded lettuce. Cooked, tender vegetables, including potatoes without skins. Vegetable juices. Broths or creamed soups made with vegetables that are not stringy or chewy. Strained tomatoes (without seeds). Fruits Canned or well-cooked fruits. Soft (ripe), peeled fresh fruits, such as peaches, nectarines, kiwi, cantaloupe, honeydew melon, and watermelon (without seeds). Soft berries with small seeds, such as strawberries. Fruit juices (without pulp). Meats and Other Protein Sources Moist, tender, lean beef. Mutton. Lamb. Veal. Chicken. [place]. Liver. Ham. Fish without bones. Eggs. Dairy Milk, milk drinks, and cream. Plain cream cheese and cottage cheese. Plain yogurt. Sweets/Desserts Flavored gelatin desserts. Custard. Plain ice cream, frozen yogurt, sherbet, milk shakes, and malts. Plain cakes and cookies. Plain hard candy. Other Butter, margarine (without trans fat), and cooking oils. Mayonnaise. Cream sauces. Mild spices, salt, and sugar. Syrup, molasses, honey, and jelly. The items listed above may not be a complete list of recommended foods or beverages. Contact your dietitian for more options. What foods are not recommended? Grains Dry bread, toast, crackers that have not been moistened. Coarse or dry cereals, such as bran, granola, and shredded wheat. Tough or chewy crusty breads, such as [place] bread or baguettes. Vegetables Corn. Raw vegetables except shredded lettuce. Cooked vegetables that are tough or stringy. Tough, crisp, fried potatoes and potato skins. Fruits Fresh fruits with skins or seeds or both, such as apples, pears, or grapes. Stringy, high-pulp fruits, such as papaya, pineapple, coconut, or mango. Fruit leather, fruit roll-ups, and all dried fruits. Meats and Other Protein Sources Sausages and hot dogs. Meats with gristle. Fish with bones. Nuts, seeds, and chunky peanut or other nut butters. Sweets/Desserts Cakes or cookies that are very dry or  chewy. The items listed above may not be a complete list of foods and beverages to avoid. Contact your dietitian for more information. This information is not intended to replace advice given to you by your health care provider. Make sure you discuss any questions you have with your health care provider. Document Released: 11/21/2007 Document Revised: 01/20/2016 Document Reviewed: 07/11/2013 Elsevier Interactive Patient Education  2017 [employer]

## 2017-04-03 NOTE — Progress Notes (Signed)
Inpatient Diabetes Program Recommendations  AACE/ADA: New Consensus Statement on Inpatient Glycemic Control (2015)  Target Ranges:  Prepandial:   less than 140 mg/dL      Peak postprandial:   less than 180 mg/dL (1-2 hours)      Critically ill patients:  140 - 180 mg/dL   Lab Results  Component Value Date   GLUCAP 228 (H) 04/03/2017   HGBA1C 13.1 (H) 03/31/2017    Review of Glycemic Control  To be discharged on Lantus pen - 35 units QAM Novolog 6 units tidwc + 0-20 units tidwc and hs Has prescriptions for pen needles and glucose meter kit and supplies.  Educated patient on insulin pen use at home. Reviewed all steps of insulin pen including attachment of needle, 2-unit air shot, dialing up dose, giving injection, removing needle, disposal of sharps, storage of unused insulin, disposal of insulin etc. Patient able to provide successful return demonstration. Also reviewed troubleshooting with insulin pen. MD to give patient Rxs for insulin pens and insulin pen needles.  Discussed survival skills -  F/U with MD who will be managing his diabetes within 1 week of discharge. Blood glucose monitoring - before meals and HS Diet - portion control, heart-healthy choices, smaller portions of high CHO foods (substituting diet soda, Crystal light, brown rice) Exercise - "Sweat at least 30 min per day." Stress management Take insulin as prescribed.  Answered questions. Interested in OP Diabetes Education. Will order.  Pt seems to be motivated to make lifestyle changes to control his blood sugars.  Thank you. Lorenda Peck, RD, LDN, CDE Inpatient Diabetes Coordinator [phone]

## 2017-04-03 NOTE — Discharge Summary (Signed)
Physician Discharge Summary  Andre Chandler TDS:[id number] DOB: 29-May-1966 DOA: 03/30/2017  PCP: System, P[facility] Not In . 67 [facility]   Admit date: 03/30/2017 Discharge date: 04/03/2017  Admitted From: Home Disposition:  Home  Recommendations for Outpatient Follow-up:  1. Follow up with PCP in 1 week 2. Ha1c 13.1. Need better DM control. Discharged home on lantus and novolog. Will need close follow up.  3. Continue soft diet for pancreatitis, advance to carb modified diet. Refrain from any alcohol or fatty foods   Discharge Condition: Stable CODE STATUS: Full  Diet recommendation: Soft diet, advance as tolerated to carb modified   Brief/Interim Summary: Andre Chandler a 51 y.o.malewith history of diabetes mellitus type 2, seizures, hypertension, Tourette's presents to the ER because of persistent weakness and fatigue last 3-4 days. Patient also has been on some nausea with abdominal discomfort. Denies any vomiting. Patient had stopped his metformin last week due to diarrhea and GI side effects and patient primary care physician was planning to change to Glucotrol. Patient is yet to start the Glucotrol. In the ER patient blood sugars found to be 768 with bicarbonate of 9 and creatinine of 2.1 with markedly elevated gap. Patient was started on fluid bolus and IV insulin infusion for DKA.   He was treated for DKA, anion gap was closed. He was transitioned to subcutaneous Lantus and Novolog sliding scale insulin. Workup revealed pancreatitis, right upper quadrant ultrasound was suboptimal due to the patient's body habitus. His diet was slowly advanced, with good toleration. On the day of discharge, he did not have any further nausea or vomiting. No abdominal pain this morning.  Discharge Diagnoses:  Principal Problem:   DKA (diabetic ketoacidoses) ([place]) Active Problems:   Seizure ([place])   Essential hypertension   Tourette's   ARF (acute renal failure) ([facility])   Abdominal pain   OSA  (obstructive sleep apnea)   DKA -Has recently stopped metformin due to GI side effects, has not yet started glucotrol -DKA likely in setting of pancreatitis. Initially treated with IV insulin. Anion gap closed, transition to subq insulin, lantus, sliding scale -Ha1c 13.1. Prescribed insulin at discharge. Diabetic coordinator and CM consulted.   Metabolic acidosis -Improved, trend labs   Acute pancreatitis, unclear etiology  -Elevated lipase with abdominal pain, no CT evidence of pancreatitis (without contrast) -RUQ [place] limited due to body habitus, gallbladder not visualized  -TG 112 -IVF, supportive care, pain management  -Improving in symptoms, no further vomiting and abdominal pain resolved. Tolerating diet   AKI -Resolved. Resume lisinopril   Hypertension -Resume lisinopril   Hx of seizure  -Continue carbamazepine  Tourette's syndrome -Continue clonidine  Sleep apnea -CPAP qhs   GERD -PPI   Morbid obesity -Body mass index is 45.42 kg/m.  Discharge Instructions  Discharge Instructions    Call MD for:  difficulty breathing, headache or visual disturbances    Complete by:  As directed    Call MD for:  extreme fatigue    Complete by:  As directed    Call MD for:  hives    Complete by:  As directed    Call MD for:  persistant dizziness or light-headedness    Complete by:  As directed    Call MD for:  persistant nausea and vomiting    Complete by:  As directed    Call MD for:  severe uncontrolled pain    Complete by:  As directed    Call MD for:  temperature >100.4  Complete by:  As directed    Discharge instructions    Complete by:  As directed    You were cared for by a hospitalist during your hospital stay. If you have any questions about your discharge medications or the care you received while you were in the hospital after you are discharged, you can call the unit and asked to speak with the hospitalist on call if the hospitalist that took care  of you is not available. Once you are discharged, your primary care physician will handle any further medical issues. Please note that NO REFILLS for any discharge medications will be authorized once you are discharged, as it is imperative that you return to your primary care physician (or establish a relationship with a primary care physician if you do not have one) for your aftercare needs so that they can reassess your need for medications and monitor your lab values.   Increase activity slowly    Complete by:  As directed      Allergies as of 04/03/2017      Reactions   Amoxicillin    Mold allergy    Other    Pt doesn't take steroids because it interferes with tegretol       Medication List    TAKE these medications   albuterol 108 (90 Base) MCG/ACT inhaler Commonly known as:  PROVENTIL HFA;VENTOLIN HFA Inhale 1-2 puffs into the lungs every 6 (six) hours as needed for wheezing or shortness of breath.   aspirin EC 81 MG tablet Take 81 mg by mouth daily.   blood glucose meter kit and supplies Dispense based on patient and insurance preference. Use up to four times daily as directed. (FOR ICD-9 250.00, 250.01).   carbamazepine 200 MG 12 hr tablet Commonly known as:  TEGRETOL XR Take 600 mg by mouth 2 (two) times daily.   cetirizine 10 MG tablet Commonly known as:  ZYRTEC Take 10 mg by mouth at bedtime.   cholecalciferol 1000 units tablet Commonly known as:  VITAMIN D Take 2,000 Units by mouth daily.   cloNIDine 0.1 MG tablet Commonly known as:  CATAPRES Take 0.1 mg by mouth 3 (three) times daily.   CoQ10 200 MG Caps Take 200 mg by mouth daily.   Cyanocobalamin 5000 MCG Caps Take 5,000 mcg by mouth daily.   ferrous sulfate 325 (65 FE) MG EC tablet Take 325 mg by mouth 3 (three) times daily.   insulin aspart 100 UNIT/ML FlexPen Commonly known as:  NOVOLOG FLEXPEN 6 units with each meal PLUS sliding scale (0-20 units) with each meal. Please see discharge paperwork for  sliding scale.   Insulin Glargine 100 UNIT/ML Solostar Pen Commonly known as:  LANTUS SOLOSTAR Inject 35 Units into the skin daily.   Insulin Pen Needle 31G X 5 MM Misc Use for insulin, up to 4 times daily   Krill Oil 500 MG Caps Take 500 mg by mouth 2 (two) times daily.   lisinopril 20 MG tablet Commonly known as:  PRINIVIL,ZESTRIL Take 20 mg by mouth daily.   montelukast 10 MG tablet Commonly known as:  SINGULAIR Take 10 mg by mouth at bedtime.   multivitamin with minerals Tabs tablet Take 1 tablet by mouth daily.   pantoprazole 20 MG tablet Commonly known as:  PROTONIX Take 1 tablet (20 mg total) by mouth 2 (two) times daily.   Turmeric 500 MG Tabs Take 500 mg by mouth daily.      Follow-up Information  Your [facility] at [facility]. Schedule an appointment as soon as possible for a visit in 1 week(s).          Allergies  Allergen Reactions  . Amoxicillin     Mold allergy   . Other     Pt doesn't take steroids because it interferes with tegretol     Consultations:  None   Procedures/Studies: Ct Abdomen Pelvis Wo Contrast  Result Date: 03/30/2017 CLINICAL DATA:  Patient has been having a loss of appetite one incident of Vomiting. Generalized malaise and nausea. For one week EXAM: CT ABDOMEN AND PELVIS WITHOUT CONTRAST TECHNIQUE: Multidetector CT imaging of the abdomen and pelvis was performed following the standard protocol without IV contrast. COMPARISON:  None. FINDINGS: Lower chest: No acute abnormality. Hepatobiliary: The liver is diffusely low attenuation, compatible with hepatic steatosis. No focal suspicious liver lesions are identified. There are focal areas of fatty sparing. The gallbladder is present. Pancreas: Unremarkable. No pancreatic ductal dilatation or surrounding inflammatory changes. Spleen: Normal in size without focal abnormality. Adrenals/Urinary Tract: Adrenal glands are normal in appearance. No renal mass. No hydronephrosis. No intrarenal stones or  ureteral obstruction. Urinary bladder is decompressed. Stomach/Bowel: The stomach and small bowel loops are normal in appearance. Appendix is not seen. Colon is normal in appearance. Vascular/Lymphatic: No significant vascular findings are present. No enlarged abdominal or pelvic lymph nodes. Reproductive: Minimal prostatic calcifications are present. Otherwise the prostate is normal in size and appearance. Seminal vesicles are normal in appearance. Other: No abdominal wall hernia or abnormality. No abdominopelvic ascites. Musculoskeletal: There is mild degenerative change in the lower thoracic N lumbosacral spine. Disc height loss and vacuum disc at identified at L5-S1. There is 5 mm anterolisthesis of L5 on S1. Bilateral pars defects at L5. No suspicious lytic or blastic lesions are identified. IMPRESSION: 1. Hepatic steatosis. 2. No renal stones or hydronephrosis. 3. Appendix is not well seen but there is no evidence for acute appendicitis. 4. Bilateral pars defects at L5, associated with grade 1 anterolisthesis of L5 on S1. Electronically Signed   By: Nolon Nations M.D.   On: 03/30/2017 16:38   US Abdomen Limited Ruq  Result Date: 04/01/2017 CLINICAL DATA:  Pancreatitis. EXAM: ULTRASOUND ABDOMEN LIMITED RIGHT UPPER QUADRANT COMPARISON:  None. FINDINGS: Gallbladder: Gallbladder is not able to be seen, presumably related to patient body habitus and/or obscuration by overlying bowel. Common bile duct: Diameter: Poorly seen. Small visible portion measured at 5 mm diameter. Liver: Diffusely echogenic indicating fatty infiltration. No focal lesion is seen within the liver although characterization is limited by the underlying fatty infiltration. IMPRESSION: 1. Very limited study due to patient body habitus. Gallbladder is not able to be seen, presumably related to the body habitus and/or obscuration by overlying bowel. 2. Majority of the common bile duct is not seen. Small visible portion appears normal  measuring 5 mm diameter. 3. Fatty infiltration of the liver. Electronically Signed   By: Franki Cabot M.D.   On: 04/01/2017 10:53       Discharge Exam: Vitals:   04/02/17 2205 04/03/17 0634  BP: 130/70 132/74  Pulse: 77 73  Resp: 18 18  Temp: 97.7 F (36.5 C) 98.2 F (36.8 C)   Vitals:   04/02/17 0433 04/02/17 1500 04/02/17 2205 04/03/17 0634  BP: (!) 157/75 (!) 156/86 130/70 132/74  Pulse: 78 73 77 73  Resp: 18 18 18 18   Temp: 98.2 F (36.8 C) 97.7 F (36.5 C) 97.7 F (36.5 C) 98.2 F (36.8  C)  TempSrc: Oral Oral Oral Oral  SpO2: 97% 98% 98% 97%  Weight:      Height:        General: Pt is alert, awake, not in acute distress Cardiovascular: RRR, S1/S2 +, no rubs, no gallops Respiratory: CTA bilaterally, no wheezing, no rhonchi Abdominal: Soft, NT, ND, bowel sounds + Extremities: no edema, no cyanosis    The results of significant diagnostics from this hospitalization (including imaging, microbiology, ancillary and laboratory) are listed below for reference.     Microbiology: Recent Results (from the past 240 hour(s))  MRSA PCR Screening     Status: None   Collection Time: 03/30/17  9:20 PM  Result Value Ref Range Status   MRSA by PCR NEGATIVE NEGATIVE Final    Comment:        The GeneXpert MRSA Assay (FDA approved for NASAL specimens only), is one component of a comprehensive MRSA colonization surveillance program. It is not intended to diagnose MRSA infection nor to guide or monitor treatment for MRSA infections.      Labs: BNP (last 3 results) No results for input(s): BNP in the last 8760 hours. Basic Metabolic Panel:  Recent Labs Lab 03/30/17 1419  03/31/17 0128 03/31/17 0730 04/01/17 0414 04/02/17 0456 04/03/17 0436  NA 123*  < > 133* 133* 134* 136 134*  K 5.9*  < > 4.1 3.8 3.8 3.5 3.6  CL 94*  < > 108 108 110 109 104  CO2 9*  < > 12* 13* 13* 17* 18*  GLUCOSE 768*  < > 188* 189* 294* 228* 233*  BUN 57*  < > 57* 61* 37* 17 12   CREATININE 2.16*  < > 2.16* 2.15* 1.14 0.82 0.73  CALCIUM 9.7  < > 9.0 9.1 9.2 9.5 9.5  MG 2.9*  --   --   --   --   --   --   < > = values in this interval not displayed. Liver Function Tests:  Recent Labs Lab 03/30/17 1419  AST 34  ALT 62  ALKPHOS 84  BILITOT 1.4*  PROT 8.5*  ALBUMIN 4.0    Recent Labs Lab 03/30/17 1419 03/31/17 0947  LIPASE 242* 1,036*   No results for input(s): AMMONIA in the last 168 hours. CBC:  Recent Labs Lab 03/30/17 1419  WBC 9.6  NEUTROABS 7.5  HGB 15.8  HCT 45.7  MCV 86.2  PLT 219   Cardiac Enzymes:  Recent Labs Lab 03/30/17 2221  TROPONINI <0.03   BNP: Invalid input(s): POCBNP CBG:  Recent Labs Lab 04/02/17 0751 04/02/17 1222 04/02/17 1744 04/02/17 2202 04/03/17 0748  GLUCAP 228* 237* 188* 180* 228*   D-Dimer No results for input(s): DDIMER in the last 72 hours. Hgb A1c No results for input(s): HGBA1C in the last 72 hours. Lipid Profile  Recent Labs  04/01/17 0414  CHOL 174  HDL 62  LDLCALC 90  TRIG 112  CHOLHDL 2.8   Thyroid function studies No results for input(s): TSH, T4TOTAL, T3FREE, THYROIDAB in the last 72 hours.  Invalid input(s): FREET3 Anemia work up No results for input(s): VITAMINB12, FOLATE, FERRITIN, TIBC, IRON, RETICCTPCT in the last 72 hours. Urinalysis    Component Value Date/Time   COLORURINE YELLOW 03/30/2017 1410   APPEARANCEUR HAZY (A) 03/30/2017 1410   LABSPEC 1.020 03/30/2017 1410   PHURINE 5.0 03/30/2017 1410   GLUCOSEU >=500 (A) 03/30/2017 1410   HGBUR SMALL (A) 03/30/2017 1410   BILIRUBINUR NEGATIVE 03/30/2017 1410   KETONESUR  20 (A) 03/30/2017 1410   PROTEINUR 30 (A) 03/30/2017 1410   NITRITE NEGATIVE 03/30/2017 1410   LEUKOCYTESUR NEGATIVE 03/30/2017 1410   Sepsis Labs Invalid input(s): PROCALCITONIN,  WBC,  LACTICIDVEN Microbiology Recent Results (from the past 240 hour(s))  MRSA PCR Screening     Status: None   Collection Time: 03/30/17  9:20 PM  Result Value  Ref Range Status   MRSA by PCR NEGATIVE NEGATIVE Final    Comment:        The GeneXpert MRSA Assay (FDA approved for NASAL specimens only), is one component of a comprehensive MRSA colonization surveillance program. It is not intended to diagnose MRSA infection nor to guide or monitor treatment for MRSA infections.      Time coordinating discharge: 40 minutes  SIGNED:  Dessa Phi, DO [facility] Pager [phone]  If 7PM-7AM, please contact night-coverage [email or web address] Password [email or web address] 04/03/2017, 1:09 PM

## 2017-04-03 NOTE — Progress Notes (Signed)
Pt discharged to home with mother, discharge teaching complete on diabetes and insulin administration. Pt safely injected insulin in abdomen. Will cont to enforce care and will participate in diabetic classes. Mother receptive and supportive. SRP, RN

## 2017-04-03 NOTE — Progress Notes (Signed)
PT Cancellation Note / Screen  Patient Details Name: Andre Chandler MRN: [id number] DOB: 11/24/1965   Cancelled Treatment:    Reason Eval/Treat Not Completed: PT screened, no needs identified, will sign off Pt discharged per RN and does not appear to have any needs at this time.     Susanne Baumgarner,KATHrine E 04/03/2017, 10:35 AM Zenovia JarredKati Leolia Vinzant, PT, DPT 04/03/2017 Pager: [phone]

## 2017-04-03 NOTE — Progress Notes (Signed)
OT Cancellation Note  Patient Details Name: Andre Chandler MRN: [id number] DOB: 12/14/1965   Cancelled Treatment:    Reason Eval/Treat Not Completed: OT screened, no needs identified, will sign off.   [name], OTR/L [phone]   [name], [name] 04/03/2017, 11:50 AM

## 2017-04-03 NOTE — Plan of Care (Signed)
Problem: Coping: Goal: Ability to adjust to condition or change in health will improve Outcome: Progressing Pt require additional teaching in self insulin administration.

## 2017-11-01 IMAGING — US US ABDOMEN LIMITED
1 series · 14 of 24 positions shown · non-contrast
Comparison: None.

CLINICAL DATA: Pancreatitis.

EXAM:
ULTRASOUND ABDOMEN LIMITED RIGHT UPPER QUADRANT

[Series 1: us abdomen limited · 0.23mm/px · 14 of 24 slices shown]
[im 1/24]
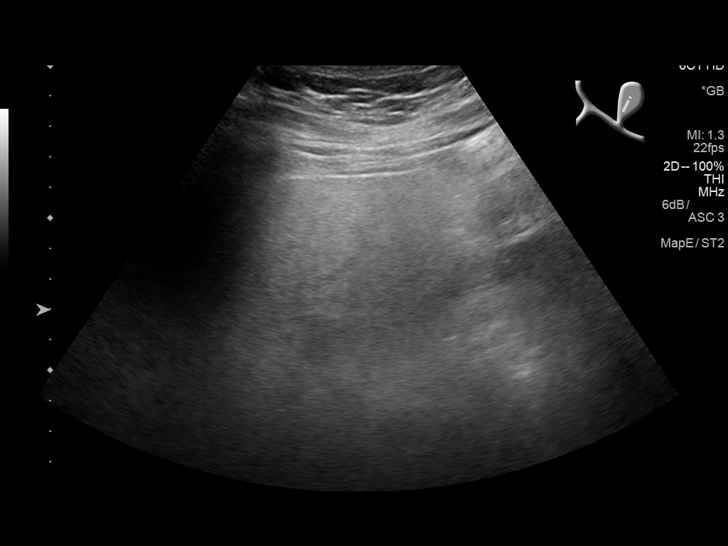
[im 3/24]
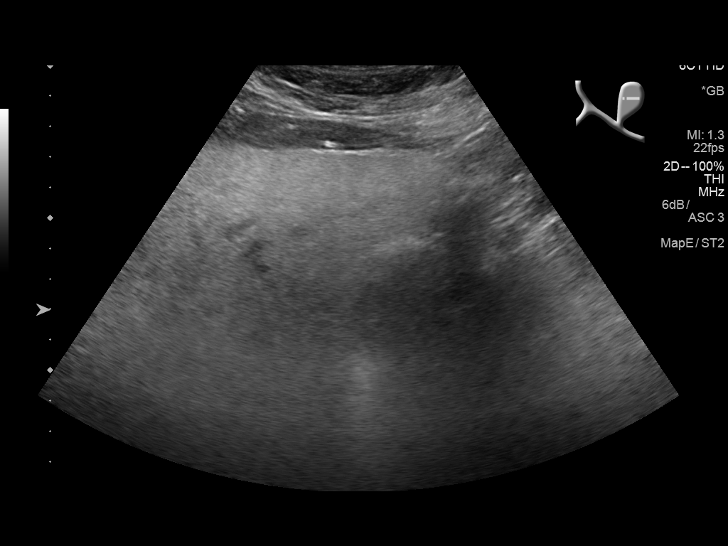
[im 5/24]
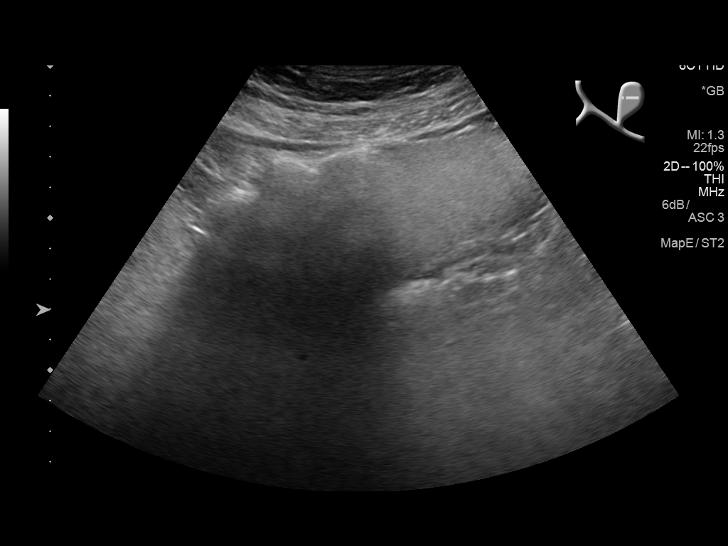
[im 7/24]
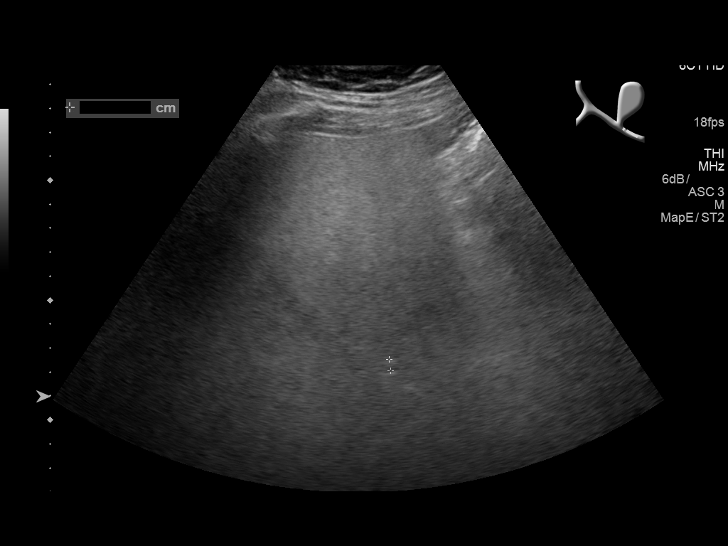
[im 8/24]
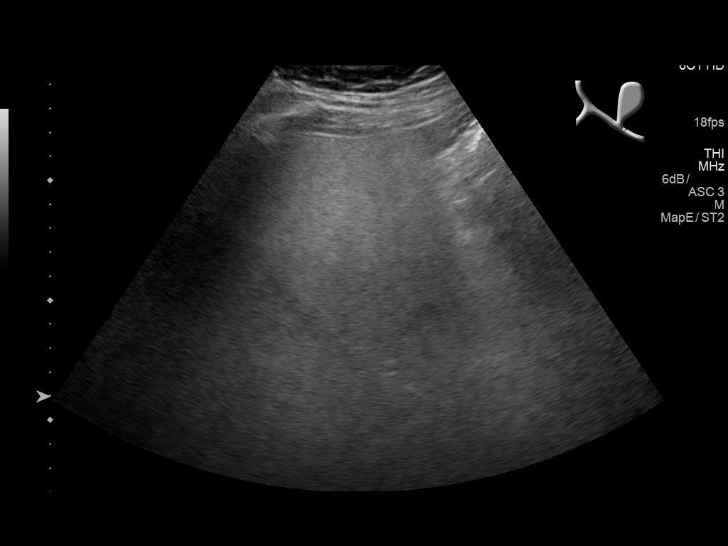
[im 10/24]
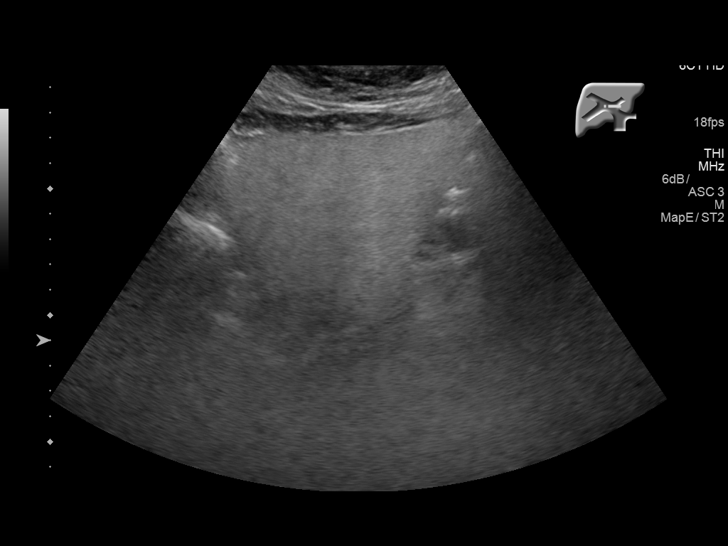
[im 12/24]
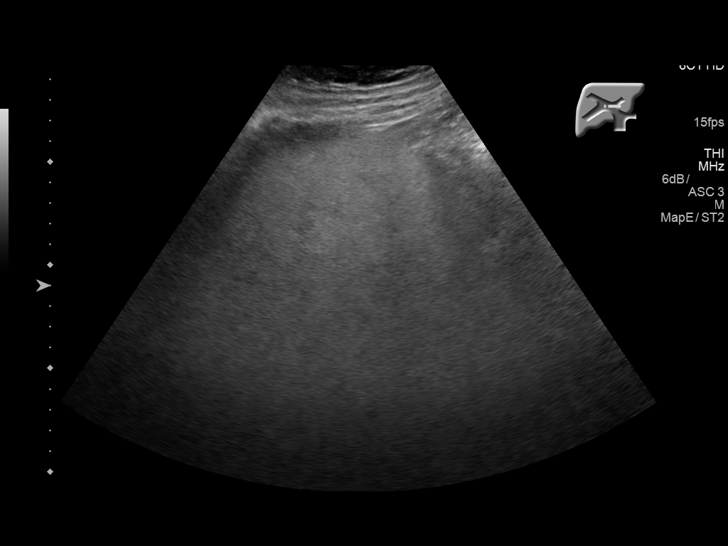
[im 13/24]
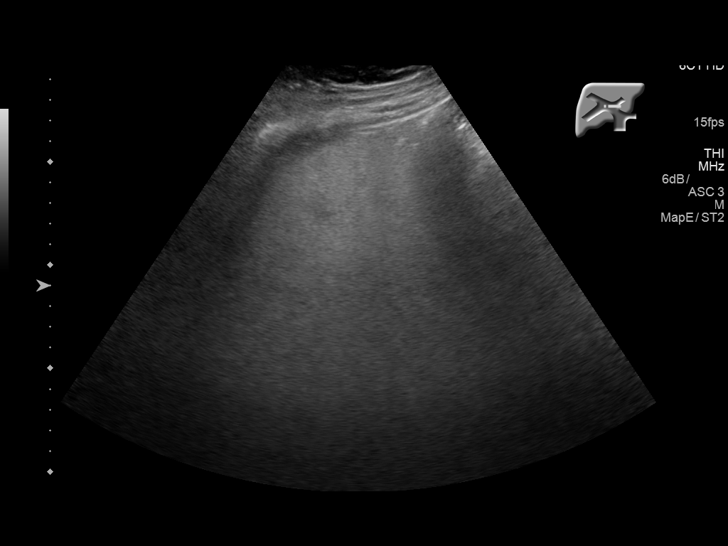
[im 15/24]
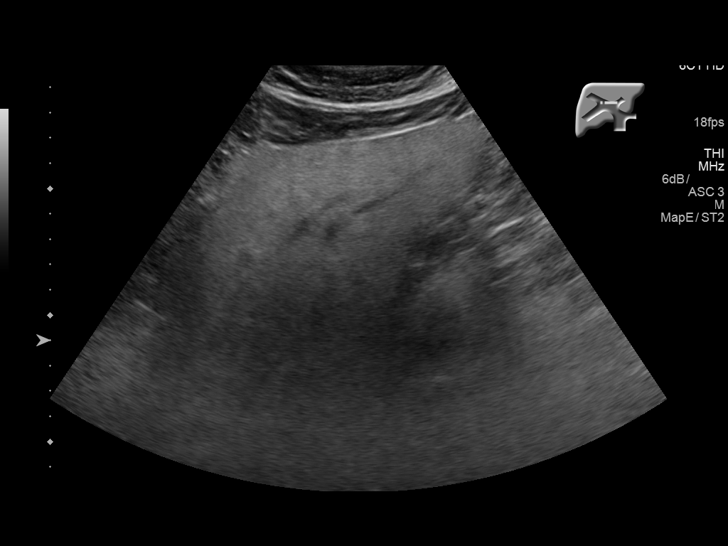
[im 17/24]
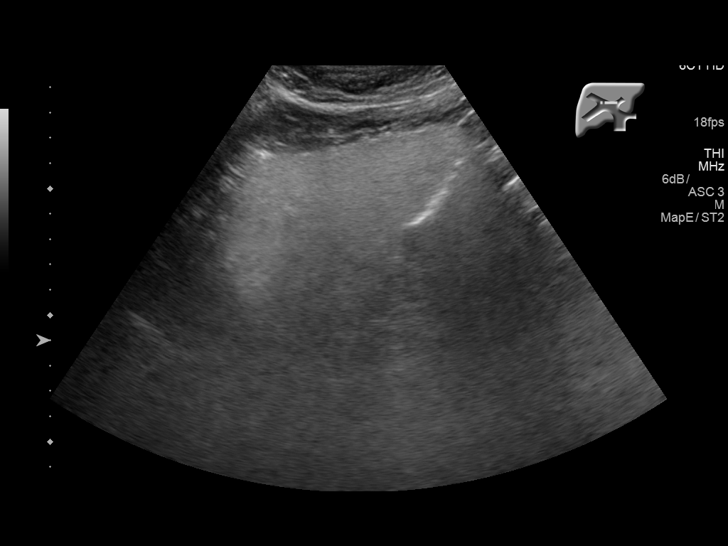
[im 19/24]
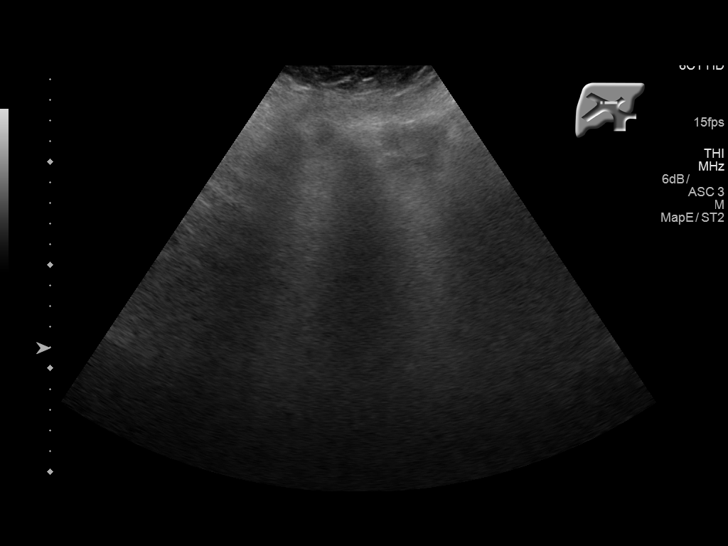
[im 20/24]
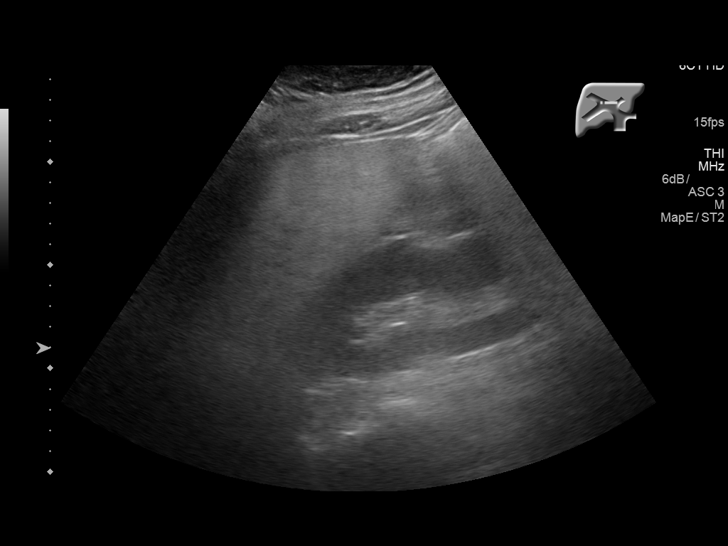
[im 22/24]
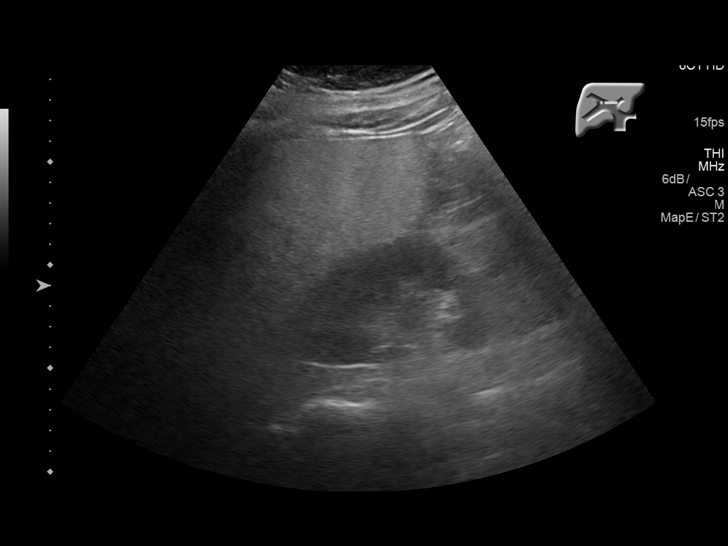
[im 24/24]
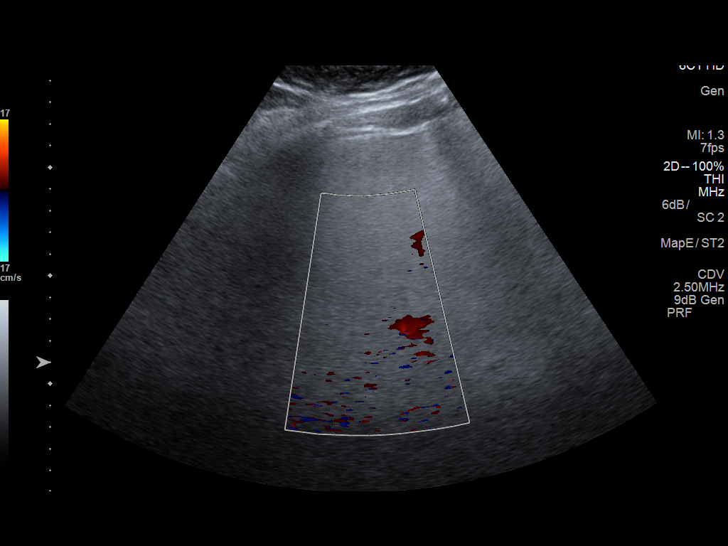

[14 of 24 positions shown; findings below may reference images not displayed]

FINDINGS: Gallbladder:

Gallbladder is not able to be seen, presumably related to patient
body habitus and/or obscuration by overlying bowel.

Common bile duct:

Diameter: Poorly seen. Small visible portion measured at 5 mm
diameter.

Liver:

Diffusely echogenic indicating fatty infiltration. No focal lesion
is seen within the liver although characterization is limited by the
underlying fatty infiltration.
IMPRESSION: 1. Very limited study due to patient body habitus. Gallbladder is
not able to be seen, presumably related to the body habitus and/or
obscuration by overlying bowel.
2. Majority of the common bile duct is not seen. Small visible
portion appears normal measuring 5 mm diameter.
3. Fatty infiltration of the liver.

## 2018-09-05 IMAGING — CT CT ABD-PELV W/O CM
2 of 4 series · 16 of 46 positions shown, 18 images · non-contrast
Comparison: None.

CLINICAL DATA: Patient has been having a loss of appetite one
incident of Vomiting. Generalized malaise and nausea. For one week

EXAM:
CT ABDOMEN AND PELVIS WITHOUT CONTRAST
TECHNIQUE: Multidetector CT imaging of the abdomen and pelvis was performed
following the standard protocol without IV contrast.

[Series 2: abd/pel w/o · axial · non-contrast · 0.93mm/px · z∈[-521,-86]mm · 13 of 101 slices shown, 15 images]
[im 7/101  soft-tissue]
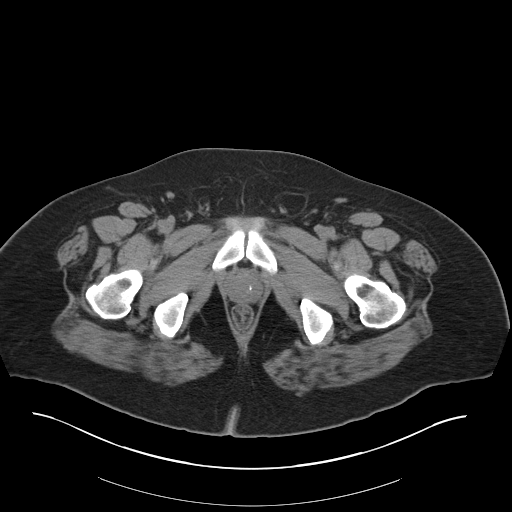
[im 7/101  bone]
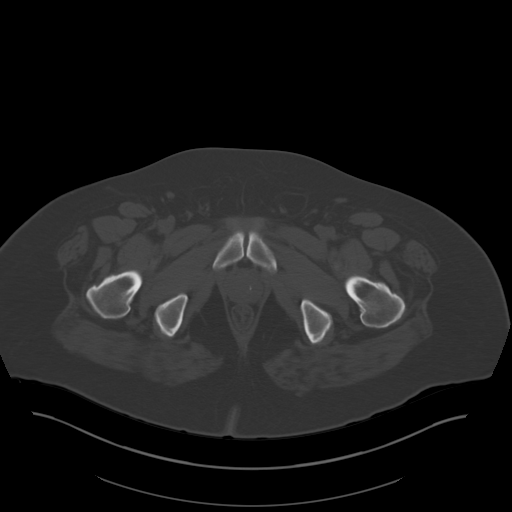
[im 13/101  soft-tissue]
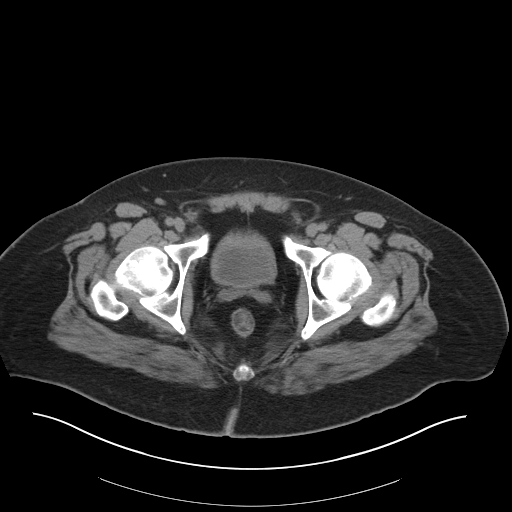
[im 19/101  soft-tissue]
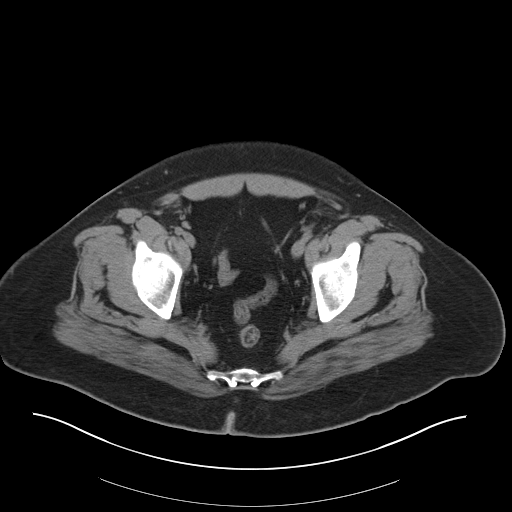
[im 32/101  soft-tissue]
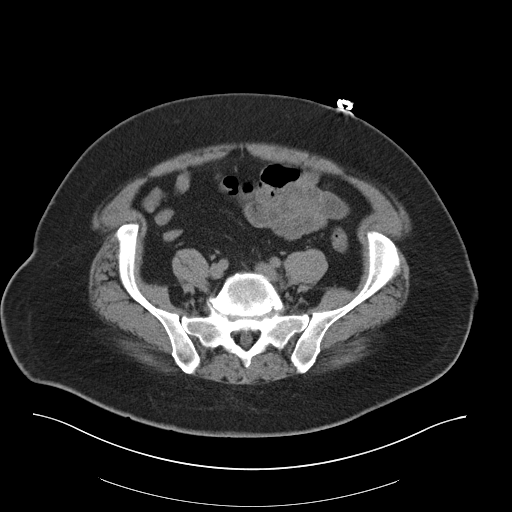
[im 38/101  soft-tissue]
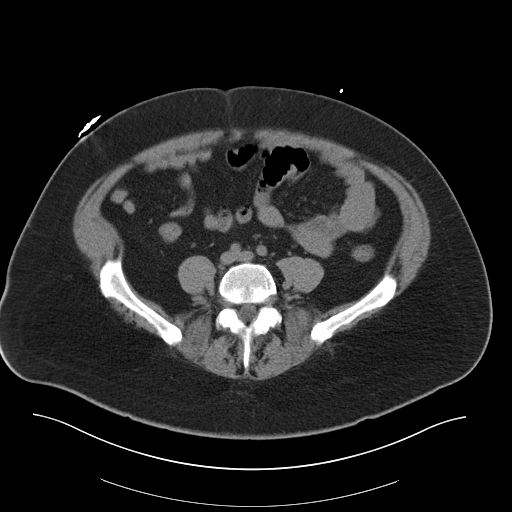
[im 44/101  soft-tissue]
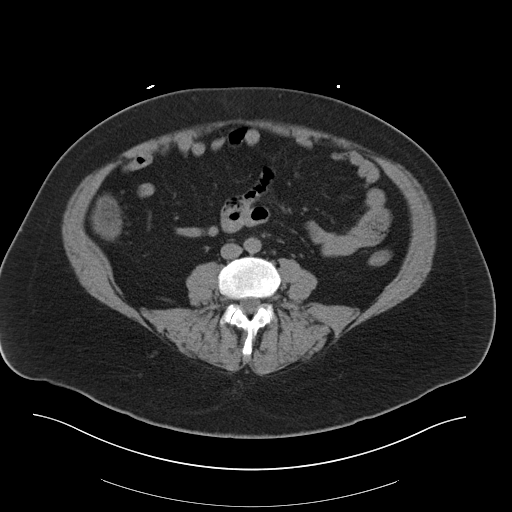
[im 51/101  soft-tissue]
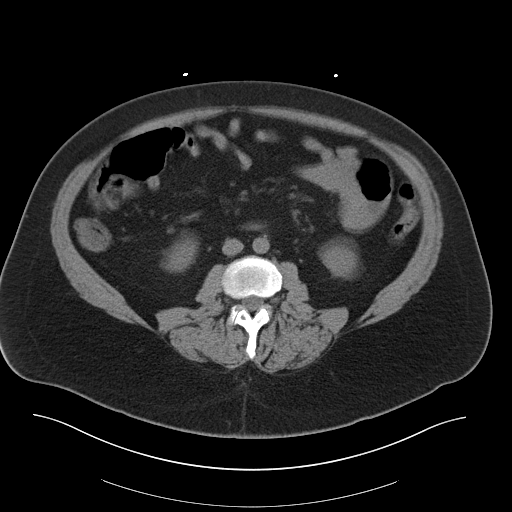
[im 57/101  soft-tissue]
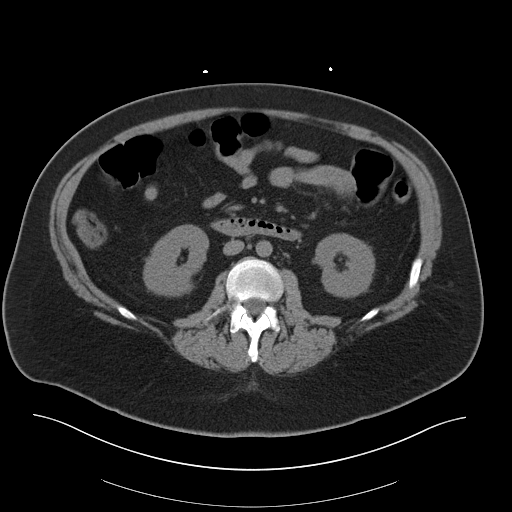
[im 63/101  soft-tissue]
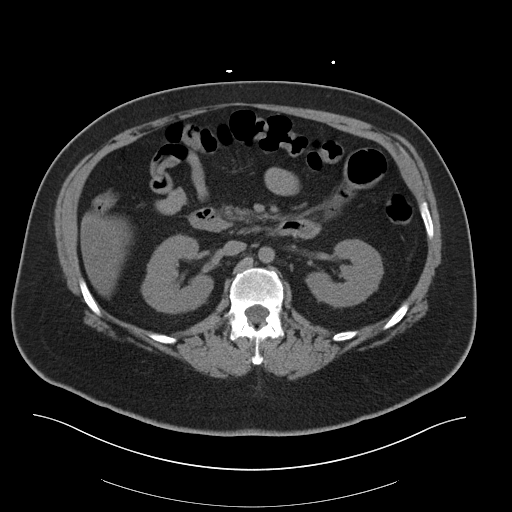
[im 63/101  bone]
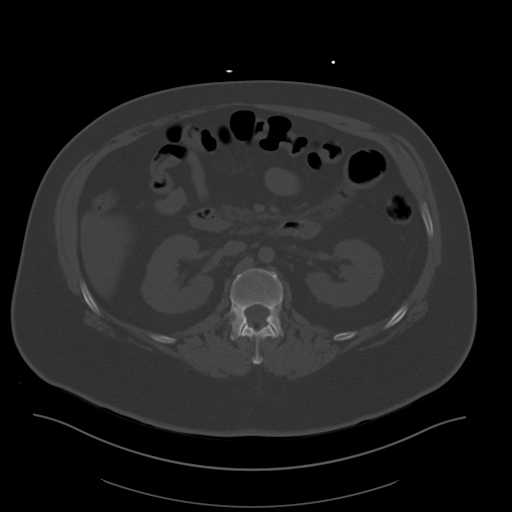
[im 69/101  soft-tissue]
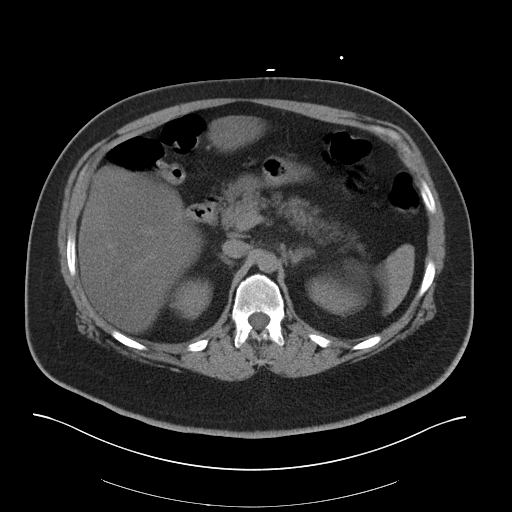
[im 82/101  soft-tissue]
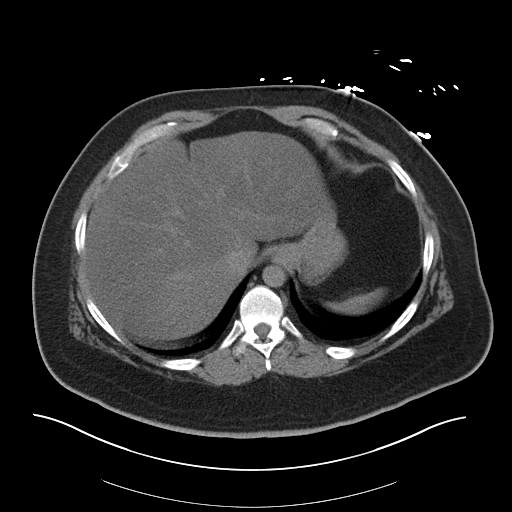
[im 88/101  soft-tissue]
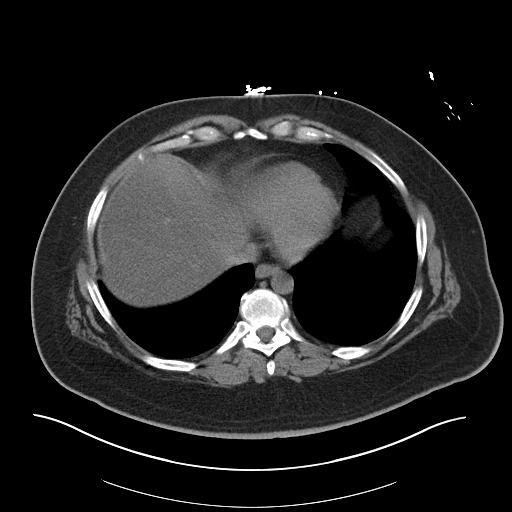
[im 94/101  soft-tissue]
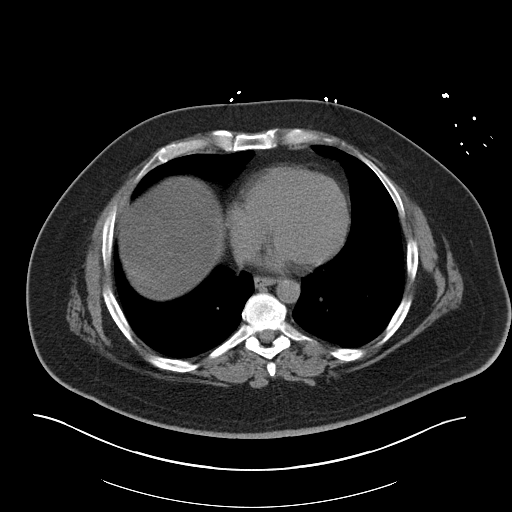

[Series 4: coronal · coronal · 0.84mm/px · 3 of 191 slices shown]
[im 64/191  soft-tissue]
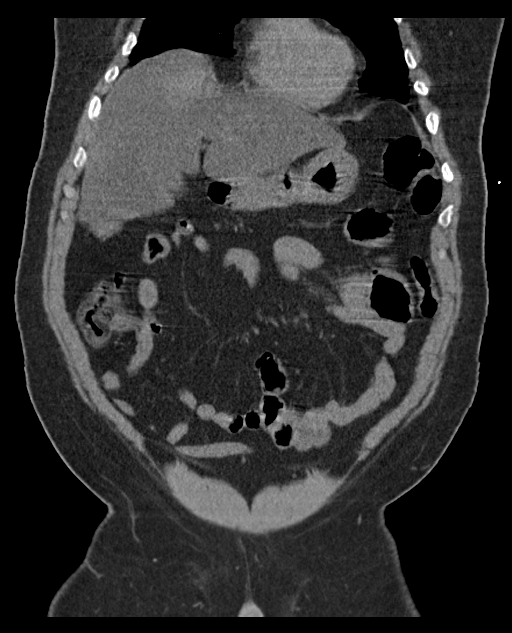
[im 85/191  soft-tissue]
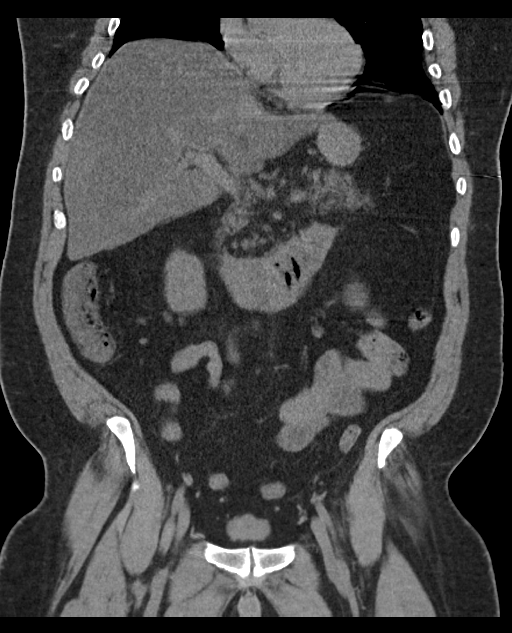
[im 106/191  soft-tissue]
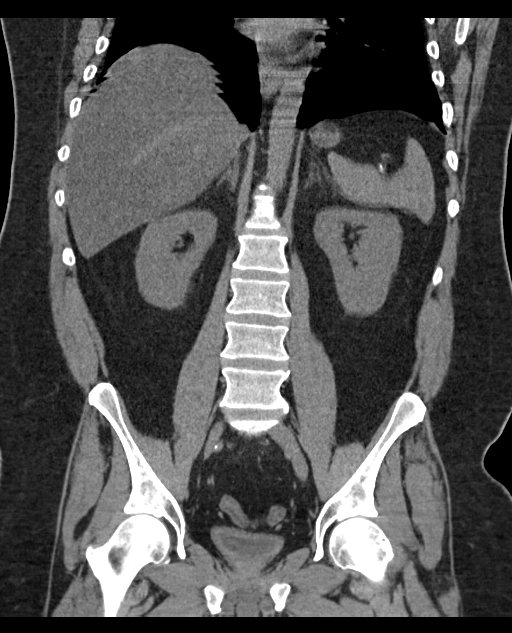

[16 of 46 positions shown; findings below may reference images not displayed]

FINDINGS: Lower chest: No acute abnormality.

Hepatobiliary: The liver is diffusely low attenuation, compatible
with hepatic steatosis. No focal suspicious liver lesions are
identified. There are focal areas of fatty sparing. The gallbladder
is present.

Pancreas: Unremarkable. No pancreatic ductal dilatation or
surrounding inflammatory changes.

Spleen: Normal in size without focal abnormality.

Adrenals/Urinary Tract: Adrenal glands are normal in appearance. No
renal mass. No hydronephrosis. No intrarenal stones or ureteral
obstruction. Urinary bladder is decompressed.

Stomach/Bowel: The stomach and small bowel loops are normal in
appearance. Appendix is not seen. Colon is normal in appearance.

Vascular/Lymphatic: No significant vascular findings are present. No
enlarged abdominal or pelvic lymph nodes.

Reproductive: Minimal prostatic calcifications are present.
Otherwise the prostate is normal in size and appearance. Seminal
vesicles are normal in appearance.

Other: No abdominal wall hernia or abnormality. No abdominopelvic
ascites.

Musculoskeletal: There is mild degenerative change in the lower
thoracic N lumbosacral spine. Disc height loss and vacuum disc at
identified at L5-S1. There is 5 mm anterolisthesis of L5 on S1.
Bilateral pars defects at L5. No suspicious lytic or blastic lesions
are identified.
IMPRESSION: 1. Hepatic steatosis.
2. No renal stones or hydronephrosis.
3. Appendix is not well seen but there is no evidence for acute
appendicitis.
4. Bilateral pars defects at L5, associated with grade 1
anterolisthesis of L5 on S1.
